# Patient Record
Sex: Male | Born: 1990 | Race: White | Hispanic: No | Marital: Married | Smoking: Never smoker
Health system: Southern US, Community
[De-identification: ages and names within clinical notes are randomized; demographics above are authoritative.]

## PROBLEM LIST (undated history)

## (undated) HISTORY — PX: OTHER SURGICAL HISTORY: SHX169

---

## 2017-08-02 ENCOUNTER — Other Ambulatory Visit: Payer: Self-pay

## 2017-08-02 ENCOUNTER — Emergency Department (HOSPITAL_COMMUNITY): Payer: BLUE CROSS/BLUE SHIELD

## 2017-08-02 ENCOUNTER — Encounter (HOSPITAL_COMMUNITY): Payer: Self-pay | Admitting: *Deleted

## 2017-08-02 ENCOUNTER — Observation Stay (HOSPITAL_COMMUNITY)
Admission: EM | Admit: 2017-08-02 | Discharge: 2017-08-08 | Disposition: A | Discharge status: Home / Self Care | Payer: BLUE CROSS/BLUE SHIELD | Attending: Student | Admitting: Student

## 2017-08-02 DIAGNOSIS — S93324A Dislocation of tarsometatarsal joint of right foot, initial encounter: Secondary | ICD-10-CM | POA: Diagnosis not present

## 2017-08-02 DIAGNOSIS — T148XXA Other injury of unspecified body region, initial encounter: Secondary | ICD-10-CM

## 2017-08-02 DIAGNOSIS — S92142A Displaced dome fracture of left talus, initial encounter for closed fracture: Secondary | ICD-10-CM | POA: Diagnosis not present

## 2017-08-02 DIAGNOSIS — Z79899 Other long term (current) drug therapy: Secondary | ICD-10-CM | POA: Insufficient documentation

## 2017-08-02 DIAGNOSIS — S92211A Displaced fracture of cuboid bone of right foot, initial encounter for closed fracture: Secondary | ICD-10-CM | POA: Diagnosis not present

## 2017-08-02 DIAGNOSIS — Y929 Unspecified place or not applicable: Secondary | ICD-10-CM | POA: Insufficient documentation

## 2017-08-02 DIAGNOSIS — M24072 Loose body in left ankle: Secondary | ICD-10-CM | POA: Diagnosis not present

## 2017-08-02 DIAGNOSIS — W1789XA Other fall from one level to another, initial encounter: Secondary | ICD-10-CM | POA: Diagnosis not present

## 2017-08-02 DIAGNOSIS — M65872 Other synovitis and tenosynovitis, left ankle and foot: Secondary | ICD-10-CM | POA: Insufficient documentation

## 2017-08-02 LAB — CBC WITH DIFFERENTIAL/PLATELET
BASOS PCT: 0 %
Basophils Absolute: 0 10*3/uL (ref 0.0–0.1)
EOS ABS: 0.1 10*3/uL (ref 0.0–0.7)
Eosinophils Relative: 1 %
HCT: 46 % (ref 39.0–52.0)
HEMOGLOBIN: 15 g/dL (ref 13.0–17.0)
Lymphocytes Relative: 17 %
Lymphs Abs: 2.6 10*3/uL (ref 0.7–4.0)
MCH: 27.5 pg (ref 26.0–34.0)
MCHC: 32.6 g/dL (ref 30.0–36.0)
MCV: 84.4 fL (ref 78.0–100.0)
MONOS PCT: 6 %
Monocytes Absolute: 0.8 10*3/uL (ref 0.1–1.0)
NEUTROS PCT: 76 %
Neutro Abs: 11.7 10*3/uL — ABNORMAL HIGH (ref 1.7–7.7)
Platelets: 282 10*3/uL (ref 150–400)
RBC: 5.45 MIL/uL (ref 4.22–5.81)
RDW: 12.7 % (ref 11.5–15.5)
WBC: 15.2 10*3/uL — AB (ref 4.0–10.5)

## 2017-08-02 LAB — BASIC METABOLIC PANEL
Anion gap: 14 (ref 5–15)
BUN: 11 mg/dL (ref 6–20)
CALCIUM: 9.4 mg/dL (ref 8.9–10.3)
CHLORIDE: 105 mmol/L (ref 101–111)
CO2: 21 mmol/L — ABNORMAL LOW (ref 22–32)
CREATININE: 1.1 mg/dL (ref 0.61–1.24)
GFR calc non Af Amer: >60 mL/min (ref >60)
Glucose, Bld: 89 mg/dL (ref 65–99)
Potassium: 3.2 mmol/L — ABNORMAL LOW (ref 3.5–5.1)
SODIUM: 140 mmol/L (ref 135–145)

## 2017-08-02 MED ORDER — PROPOFOL 10 MG/ML IV BOLUS
1.0000 mg/kg | Freq: Once | INTRAVENOUS | Status: AC
  Administered 2017-08-03: 70 mg via INTRAVENOUS
  Filled 2017-08-02: qty 20

## 2017-08-02 MED ORDER — SODIUM CHLORIDE 0.9 % IV BOLUS
1000.0000 mL | Freq: Once | INTRAVENOUS | Status: AC
  Administered 2017-08-02: 1000 mL via INTRAVENOUS

## 2017-08-02 MED ORDER — ONDANSETRON HCL 4 MG/2ML IJ SOLN
4.0000 mg | Freq: Once | INTRAMUSCULAR | Status: AC
  Administered 2017-08-02: 4 mg via INTRAVENOUS
  Filled 2017-08-02: qty 2

## 2017-08-02 MED ORDER — HYDROMORPHONE HCL 1 MG/ML IJ SOLN
1.0000 mg | Freq: Once | INTRAMUSCULAR | Status: AC
  Administered 2017-08-02: 1 mg via INTRAVENOUS
  Filled 2017-08-02: qty 1

## 2017-08-02 MED ORDER — SODIUM CHLORIDE 0.9 % IV BOLUS
1000.0000 mL | Freq: Once | INTRAVENOUS | Status: AC
Start: 2017-08-02 — End: 2017-08-02
  Administered 2017-08-02: 1000 mL via INTRAVENOUS

## 2017-08-02 NOTE — ED Notes (Signed)
Dr. Dalene SeltzerSchlossman ( EDP) explained conscious sedation procedure to pt.

## 2017-08-02 NOTE — ED Notes (Addendum)
Patient placed on a monitor / pulse oximetry , NS IV infusing , IV site intact , RT set up end tidal monitor . Waiting for EDP and orthopedic MD to perform reduction .

## 2017-08-02 NOTE — ED Triage Notes (Addendum)
Pt reports bilateral ankle and foot pain. Pt reports jumping off of a moving tractor. Obviously deformity to right foot and left ankle pain.

## 2017-08-02 NOTE — H&P (Signed)
[facility] (OTS) Consult   Patient ID: Jackson Watson MRN: [id number] DOB/AGE: Aug 09, 1990 27 y.o.  Reason for Consult:Right LisFranc fracture dislocation Referring Physician: Donnetta Hutching, MD [facility]  HPI: Jackson Watson is an 27 y.o. male otherwise healthy male who jumped off a tractor landing on the ground and injuring both feet and both ankles.  Right greater than left.  Denies any issues other than the bilateral ankle pain.  X-rays showed that he had a right Lisfranc fracture dislocation.  I was contacted by Dr. Adriana Simas for evaluation and treatment.  I felt with the displacement and possible skin compromise the transfer over to [facility] for closed reduction would be most appropriate.   History reviewed. No pertinent past medical history.  Past Surgical History:  Procedure Laterality Date  . L humerus fx repair    . r collarbone sugery      No family history on file.  Social History:  reports that he has never smoked. His smokeless tobacco use includes chew. He reports that he drinks alcohol. His drug history is not on file.  Allergies: No Known Allergies  Medications:  No current facility-administered medications on file prior to encounter.    No current outpatient medications on file prior to encounter.   ROS: Constitutional: No fever or chills Vision: No changes in vision ENT: No difficulty swallowing CV: No chest pain Pulm: No SOB or wheezing GI: No nausea or vomiting GU: No urgency or inability to hold urine Skin: No poor wound healing Neurologic: No numbness or tingling Psychiatric: No depression or anxiety Heme: No bruising Allergic: No reaction to medications or food   Exam: Blood pressure 132/69, pulse 100, temperature 98.9 F (37.2 C), temperature source Oral, resp. rate (!) 21, height 6\' 4"  (1.93 m), weight (!) 142.9 kg (315 lb), SpO2 97 %. General: No acute distress Orientation: Awake alert and oriented x3 Mood and Affect:  Cooperative and pleasant Gait: Unable to be assessed due to his fracture Coordination and balance: Within normal limits  Injured Extremity (CV, lymph, sensation, reflexes): Right lower extremity: Reveals obvious swelling and deformity about the midfoot.  He has his medial cuneiform that is displaced in his whole metatarsal and tarsometatarsal joints are dislocated laterally.  He has significant pain with any palpation.  He does endorse sensation to dorsal and plantar aspect of his foot.  He has a 2+ DP pulse he is unable to fully range his ankle due to pain.  No deformity about the knee or thigh.  He is able to move his knee and hip without any discomfort.  No lymphadenopathy and reflexes are not able to be assessed due to his pain.  Left lower extremity: Skin without lesions.  Notable swelling over the lateral aspect of the ankle and foot.  Diminished range of motion due to pain ROM, active dorsiflexion plantarflexion but severely limited.  Endorses sensation of the dorsum and plantar aspect of his foot..   Medical Decision Making: Imaging: X-rays of the right foot and ankle are reviewed which shows a comminuted Lisfranc fracture dislocation.  The first TMT joint is dislocated as is the second and third.  There is significant shortening and displacement with the plain radiographs it is difficult to fully assess the characteristics of the fracture pattern.  CT scan of the right foot shows a homolateral Lisfranc fracture dislocation.  There is significant comminution subluxation of all TMT joints.  There is significant impaction of the cuboid as well.  Labs:  CBC    Component Value Date/Time   WBC 15.2 (H) 08/02/2017 2041   RBC 5.45 08/02/2017 2041   HGB 15.0 08/02/2017 2041   HCT 46.0 08/02/2017 2041   PLT 282 08/02/2017 2041   MCV 84.4 08/02/2017 2041   MCH 27.5 08/02/2017 2041   MCHC 32.6 08/02/2017 2041   RDW 12.7 08/02/2017 2041   LYMPHSABS 2.6 08/02/2017 2041   MONOABS 0.8 08/02/2017  2041   EOSABS 0.1 08/02/2017 2041   BASOSABS 0.0 08/02/2017 2041    Medical history and chart was reviewed  Assessment/Plan: 27 year old male with significant right foot Lisfranc fracture dislocation left ankle and foot pain  Patient has a significant Lisfranc injury.  I feel that proceeding with surgical fixation is appropriate.  I will likely perform this in a staged manner.  The first stage would be a closed reductions and percutaneous pinning.  I would also likely ex fix the lateral column to restore length of the cuboid.  He is way too swollen to perform definitive ORIF of his Lisfranc joint.  We will plan to proceed with this on a delayed fashion.  I discussed risks and benefits with the patient. Risks discussed included bleeding requiring blood transfusion, bleeding causing a hematoma, infection, malunion, nonunion, damage to surrounding nerves and blood vessels, pain, hardware prominence or irritation, hardware failure, stiffness, post-traumatic arthritis, DVT/PE, compartment syndrome, and even death.  The patient agrees to proceed with surgery.  I attempted a closed reduction of the right foot.  However due to his size and significant amount of propofol that was given I was unsuccessful in being able to sedate him enough to successfully reduce the fracture dislocation.  However his skin was not significantly compromised and I felt that a further reduction or earlier trip to the operating room was not necessary.  Roby LoftsKevin P. Leanny Moeckel, MD [facility] [phone] (phone)

## 2017-08-02 NOTE — ED Provider Notes (Signed)
[facility] Provider Note   CSN: [id number] Arrival date & time: 08/02/17  1932     History   Chief Complaint Chief Complaint  Patient presents with  . Ankle Injury    HPI Jackson Watson is a 27 y.o. male.  Patient jumped off a tractor landing on the ground injuring both feet and both ankles (right greater than left.  No other injuries.  No head or neck trauma.  Patient came emergently to the hospital.  Severity of pain is serious.  Position and palpation make pain worse     History reviewed. No pertinent past medical history.  Patient Active Problem List   Diagnosis Date Noted  . Lisfranc dislocation, right, initial encounter 08/02/2017    History reviewed. No pertinent surgical history.      Home Medications    Prior to Admission medications   Not on File    Family History No family history on file.  Social History Social History   Tobacco Use  . Smoking status: Never Smoker  . Smokeless tobacco: Current User    Types: Chew  Substance Use Topics  . Alcohol use: Yes  . Drug use: Not on file     Allergies   Patient has no known allergies.   Review of Systems Review of Systems  All other systems reviewed and are negative.    Physical Exam Updated Vital Signs BP (!) 148/70   Pulse (!) 108   Temp 98.9 F (37.2 C) (Oral)   Resp 20   Ht 6\' 4"  (1.93 m)   Wt (!) 142.9 kg (315 lb)   SpO2 98%   BMI 38.34 kg/m   Physical Exam  Constitutional: He is oriented to person, place, and time. He appears well-developed and well-nourished.  HENT:  Head: Normocephalic and atraumatic.  Eyes: Conjunctivae are normal.  Neck: Neck supple.  Cardiovascular: Normal rate and regular rhythm.  Pulmonary/Chest: Effort normal and breath sounds normal.  Abdominal: Soft. Bowel sounds are normal.  Musculoskeletal:  Right foot: Tender midfoot with valgus angulation.  Left ankle/foot: Generalized tenderness throughout the ankle and foot.  Good  pulses distally in both feet.  Neurological: He is alert and oriented to person, place, and time.  Skin: Skin is warm and dry.  Psychiatric: He has a normal mood and affect. His behavior is normal.  Nursing note and vitals reviewed.    ED Treatments / Results  Labs (all labs ordered are listed, but only abnormal results are displayed) Labs Reviewed  CBC WITH DIFFERENTIAL/PLATELET - Abnormal; Notable for the following components:      Result Value   WBC 15.2 (*)    Neutro Abs 11.7 (*)    All other components within normal limits  BASIC METABOLIC PANEL - Abnormal; Notable for the following components:   Potassium 3.2 (*)    CO2 21 (*)    All other components within normal limits    EKG None  Radiology Dg Ankle Complete Left  Result Date: 08/02/2017 CLINICAL DATA:  Pain after jumping from a truck. EXAM: LEFT ANKLE COMPLETE - 3+ VIEW COMPARISON:  None. FINDINGS: Small avulsion fractures off the dorsum the talus across the tibiotalar and talonavicular joints. There is no evidence of arthropathy or other focal bone abnormality. Soft tissue swelling over the malleoli and induration of the Kager's fat pad. Intact ankle mortise. Intact subtalar and midfoot articulations. Small dorsal calcaneal enthesophyte. IMPRESSION: Soft tissue swelling about malleoli. Small cortical avulsions across the talonavicular and tibiotalar  joints involving the dorsum of the anterior and body of talus. Electronically Signed   By: Tollie Eth M.D.   On: 08/02/2017 20:17   Dg Ankle Complete Right  Result Date: 08/02/2017 CLINICAL DATA:  Right foot and ankle pain after jumping off a truck and landing on his feet. EXAM: RIGHT ANKLE - COMPLETE 3+ VIEW COMPARISON:  None. FINDINGS: Acute homolateral Lisfranc fracture dislocation is identified with lateral displacement of the metatarsal bases and medial dislocation of the medial cuneiform. Small ossific fracture fragments project about the midfoot likely off the base of  the metatarsals, the largest is seen on the AP view measuring 1.4 x 0.9 cm likely off the second metatarsal base. Additional 5 and 6 mm fracture fragments project over the dorsum midfoot. Intact distal tibia and fibula. Intact ankle and subtalar joints. Intact talonavicular and calcaneocuboid articulations. Calcaneal enthesopathy is seen along the plantar dorsal aspect. Intact anterolateral process of the calcaneus. IMPRESSION: Acute homolateral Lisfranc fracture-dislocation. Electronically Signed   By: Tollie Eth M.D.   On: 08/02/2017 20:16   Dg Foot Complete Left  Result Date: 08/02/2017 CLINICAL DATA:  Pain after jumping from a truck. EXAM: LEFT FOOT - COMPLETE 3+ VIEW COMPARISON:  None. FINDINGS: Small avulsions off the dorsal aspect of the talus across the tibiotalar and talonavicular joints. Lisfranc articulation appears intact. Minimal dorsal calcaneal enthesopathy. There is no evidence of arthropathy or other focal bone abnormality. Soft tissues are unremarkable. IMPRESSION: 1. Small superficial cortical avulsions off the dorsum of the body and anterior talus attention representing joint capsular avulsions. 2. No joint dislocation. 3. Periarticular soft tissue swelling about the ankle and dorsum of the midfoot. 4. Dorsal calcaneal enthesopathy. Electronically Signed   By: Tollie Eth M.D.   On: 08/02/2017 20:25   Dg Foot Complete Right  Result Date: 08/02/2017 CLINICAL DATA:  Pain after jumping from truck. EXAM: RIGHT FOOT COMPLETE - 3+ VIEW COMPARISON:  None. FINDINGS: Acute homolateral Lisfranc fracture-dislocation is identified with base of first metatarsal laterally dislocated from the medial cuneiform. There is a widened appearance of the joint space between the medial and middle cuneiform. Small fracture fragments off the lateral aspect of the cuboid and likely base of second metatarsal are noted in addition to smaller fracture fragment seen off the dorsum midfoot, donor site uncertain but  measuring between 6 and 7 mm. The tibiotalar and subtalar joints are maintained. Small calcaneal enthesophytes are noted. The toes are intact. IMPRESSION: Acute homolateral Lisfranc fracture-dislocation of the right foot as above described. Electronically Signed   By: Tollie Eth M.D.   On: 08/02/2017 20:30    Procedures Procedures (including critical care time)  Medications Ordered in ED Medications  sodium chloride 0.9 % bolus 1,000 mL (1,000 mLs Intravenous New Bag/Given 08/02/17 2046)  HYDROmorphone (DILAUDID) injection 1 mg (1 mg Intravenous Given 08/02/17 2046)  ondansetron (ZOFRAN) injection 4 mg (4 mg Intravenous Given 08/02/17 2046)     Initial Impression / Assessment and Plan / ED Course  I have reviewed the triage vital signs and the nursing notes.  Pertinent labs & imaging results that were available during my care of the patient were reviewed by me and considered in my medical decision making (see chart for details).     Patient has sustained a Lisfranc fracture/dislocation of the right foot.  Discussed with orthopedic surgeon Dr. [name].  CT foot pending.  Will transfer to [facility] emergency department.  Discussed with colleague Dr. Erin Hearing.  Pain management given.  Final Clinical Impressions(s) / ED Diagnoses   Final diagnoses:  Dislocation of tarsometatarsal joint of right foot, initial encounter    ED Discharge Orders    None       Donnetta Hutching, MD 08/02/17 2116

## 2017-08-02 NOTE — ED Notes (Signed)
Dr. Jena GaussHaddix ( ortho) explained admission and plan of care to pt .

## 2017-08-02 NOTE — ED Notes (Signed)
[facility] here to transport pt 

## 2017-08-02 NOTE — ED Notes (Signed)
[facility].

## 2017-08-02 NOTE — ED Triage Notes (Signed)
[facility]; received 1.5mg  dilaudid for pain enroute via [facility] at approximately 2210.  Per report, [facility] was able to palpate bilateral pedal pulse, [facility].

## 2017-08-02 NOTE — ED Notes (Signed)
Patient signed consent form for conscious sedation and manual reduction of right foot fracture/disclocation .

## 2017-08-02 NOTE — ED Provider Notes (Addendum)
Patient presents to the ED by EMS from any pain for Lisfranc fracture dislocation.  Patient is here to see Dr. Jena GaussHaddix with orthopedic surgery.  I evaluated patient when he arrived to the ED.  Pain is under control at this time.  Patient DP pulses are 1+ bilaterally with significant edema noted.  Limited range of motion secondary to pain.  Vital signs are reassuring.  Cap refill is normal.  Sensation intact.  I did page Dr. Jena GaussHaddix with orthopedics to let him know the patient is in the ED for his evaluation.    He has asked that we help sedate patient to allow for reduction by orthopedics.  Bedside sedation was performed by attending Dr. Dalene SeltzerSchlossman.  Dr. Jena GaussHaddix had successful reduction of dislocation.  He will place admission orders.  Patient tolerated procedure without any difficulties.  Remains hemodynamically stable.  Rise MuLeaphart, Kenneth T, PA-C 08/02/17 2320    Rise MuLeaphart, Kenneth T, PA-C 08/03/17 [id number]    Alvira MondaySchlossman, Erin, MD 08/06/17 2251

## 2017-08-03 ENCOUNTER — Observation Stay (HOSPITAL_COMMUNITY): Payer: BLUE CROSS/BLUE SHIELD

## 2017-08-03 ENCOUNTER — Observation Stay (HOSPITAL_COMMUNITY): Payer: BLUE CROSS/BLUE SHIELD | Admitting: Certified Registered Nurse Anesthetist

## 2017-08-03 ENCOUNTER — Encounter (HOSPITAL_COMMUNITY): Payer: Self-pay | Admitting: Certified Registered Nurse Anesthetist

## 2017-08-03 ENCOUNTER — Encounter (HOSPITAL_COMMUNITY)
Admission: EM | Disposition: A | Discharge status: Home / Self Care | Payer: Self-pay | Source: Home / Self Care | Attending: Emergency Medicine

## 2017-08-03 HISTORY — PX: OPEN REDUCTION INTERNAL FIXATION (ORIF) FOOT LISFRANC FRACTURE: SHX5990

## 2017-08-03 LAB — HIV ANTIBODY (ROUTINE TESTING W REFLEX): HIV Screen 4th Generation wRfx: NONREACTIVE

## 2017-08-03 SURGERY — OPEN REDUCTION INTERNAL FIXATION (ORIF) FOOT LISFRANC FRACTURE
Anesthesia: General | Site: Leg Lower | Laterality: Right

## 2017-08-03 MED ORDER — CEFAZOLIN SODIUM-DEXTROSE 2-4 GM/100ML-% IV SOLN
2.0000 g | Freq: Three times a day (TID) | INTRAVENOUS | Status: AC
  Administered 2017-08-03 – 2017-08-04 (×3): 2 g via INTRAVENOUS
  Filled 2017-08-03 (×3): qty 100

## 2017-08-03 MED ORDER — DIPHENHYDRAMINE HCL 12.5 MG/5ML PO ELIX: 12.5000 mg | ORAL_SOLUTION | ORAL | Status: DC | PRN

## 2017-08-03 MED ORDER — ACETAMINOPHEN 650 MG RE SUPP: 650.0000 mg | Freq: Four times a day (QID) | RECTAL | Status: DC | PRN

## 2017-08-03 MED ORDER — POVIDONE-IODINE 10 % EX SWAB
2.0000 "application " | Freq: Once | CUTANEOUS | Status: DC
  Filled 2017-08-03: qty 2

## 2017-08-03 MED ORDER — METHOCARBAMOL 1000 MG/10ML IJ SOLN
500.0000 mg | Freq: Four times a day (QID) | INTRAVENOUS | Status: DC | PRN
  Filled 2017-08-03: qty 5

## 2017-08-03 MED ORDER — MIDAZOLAM HCL 2 MG/2ML IJ SOLN
INTRAMUSCULAR | Status: DC | PRN
  Administered 2017-08-03: 2 mg via INTRAVENOUS

## 2017-08-03 MED ORDER — FENTANYL CITRATE (PF) 100 MCG/2ML IJ SOLN
100.0000 ug | Freq: Once | INTRAMUSCULAR | Status: AC
Start: 2017-08-03 — End: 2017-08-03
  Administered 2017-08-03: 100 ug via INTRAVENOUS

## 2017-08-03 MED ORDER — PROPOFOL 10 MG/ML IV BOLUS
INTRAVENOUS | Status: AC
  Filled 2017-08-03: qty 20

## 2017-08-03 MED ORDER — PROPOFOL 10 MG/ML IV BOLUS
INTRAVENOUS | Status: DC | PRN
  Administered 2017-08-03: 200 mg via INTRAVENOUS

## 2017-08-03 MED ORDER — PROPOFOL 10 MG/ML IV BOLUS
INTRAVENOUS | Status: AC | PRN
  Administered 2017-08-03: 130 mg via INTRAVENOUS

## 2017-08-03 MED ORDER — FENTANYL CITRATE (PF) 100 MCG/2ML IJ SOLN
INTRAMUSCULAR | Status: AC
  Administered 2017-08-03: 100 ug via INTRAVENOUS
  Filled 2017-08-03: qty 2

## 2017-08-03 MED ORDER — FENTANYL CITRATE (PF) 100 MCG/2ML IJ SOLN
100.0000 ug | Freq: Once | INTRAMUSCULAR | Status: AC
  Administered 2017-08-03: 100 ug via INTRAVENOUS

## 2017-08-03 MED ORDER — DEXTROSE 5 % IV SOLN
3.0000 g | INTRAVENOUS | Status: AC
  Administered 2017-08-03: 3 g via INTRAVENOUS
  Filled 2017-08-03: qty 3

## 2017-08-03 MED ORDER — DEXAMETHASONE SODIUM PHOSPHATE 10 MG/ML IJ SOLN
INTRAMUSCULAR | Status: AC
  Filled 2017-08-03: qty 1

## 2017-08-03 MED ORDER — OXYCODONE HCL 5 MG PO TABS
5.0000 mg | ORAL_TABLET | ORAL | Status: DC | PRN
  Administered 2017-08-03 – 2017-08-05 (×10): 10 mg via ORAL
  Filled 2017-08-03 (×10): qty 2

## 2017-08-03 MED ORDER — FENTANYL CITRATE (PF) 100 MCG/2ML IJ SOLN
50.0000 ug | Freq: Once | INTRAMUSCULAR | Status: AC
  Administered 2017-08-03: 50 ug via INTRAVENOUS
  Filled 2017-08-03: qty 2

## 2017-08-03 MED ORDER — FENTANYL CITRATE (PF) 250 MCG/5ML IJ SOLN
INTRAMUSCULAR | Status: DC | PRN
  Administered 2017-08-03: 150 ug via INTRAVENOUS
  Administered 2017-08-03 (×2): 50 ug via INTRAVENOUS

## 2017-08-03 MED ORDER — ONDANSETRON HCL 4 MG/2ML IJ SOLN
INTRAMUSCULAR | Status: AC
  Filled 2017-08-03: qty 2

## 2017-08-03 MED ORDER — ONDANSETRON HCL 4 MG PO TABS: 4.0000 mg | ORAL_TABLET | Freq: Four times a day (QID) | ORAL | Status: DC | PRN

## 2017-08-03 MED ORDER — DEXAMETHASONE SODIUM PHOSPHATE 10 MG/ML IJ SOLN
INTRAMUSCULAR | Status: AC
  Filled 2017-08-03: qty 2

## 2017-08-03 MED ORDER — SUCCINYLCHOLINE CHLORIDE 20 MG/ML IJ SOLN
INTRAMUSCULAR | Status: DC | PRN
  Administered 2017-08-03: 120 mg via INTRAVENOUS

## 2017-08-03 MED ORDER — MIDAZOLAM HCL 2 MG/2ML IJ SOLN
INTRAMUSCULAR | Status: AC
  Administered 2017-08-03: 2 mg via INTRAVENOUS
  Filled 2017-08-03: qty 2

## 2017-08-03 MED ORDER — 0.9 % SODIUM CHLORIDE (POUR BTL) OPTIME
TOPICAL | Status: DC | PRN
  Administered 2017-08-03: 1000 mL

## 2017-08-03 MED ORDER — FENTANYL CITRATE (PF) 100 MCG/2ML IJ SOLN
50.0000 ug | Freq: Once | INTRAMUSCULAR | Status: AC
Start: 2017-08-03 — End: 2017-08-03
  Administered 2017-08-03: 50 ug via INTRAVENOUS
  Filled 2017-08-03: qty 2

## 2017-08-03 MED ORDER — CHLORHEXIDINE GLUCONATE 4 % EX LIQD
60.0000 mL | Freq: Once | CUTANEOUS | Status: DC
  Filled 2017-08-03: qty 60

## 2017-08-03 MED ORDER — SUGAMMADEX SODIUM 200 MG/2ML IV SOLN
INTRAVENOUS | Status: DC | PRN
  Administered 2017-08-03: 200 mg via INTRAVENOUS

## 2017-08-03 MED ORDER — ONDANSETRON HCL 4 MG/2ML IJ SOLN
4.0000 mg | Freq: Four times a day (QID) | INTRAMUSCULAR | Status: DC | PRN
  Administered 2017-08-03 – 2017-08-07 (×2): 4 mg via INTRAVENOUS

## 2017-08-03 MED ORDER — METHOCARBAMOL 500 MG PO TABS
500.0000 mg | ORAL_TABLET | Freq: Four times a day (QID) | ORAL | Status: DC | PRN
  Administered 2017-08-03 – 2017-08-08 (×10): 500 mg via ORAL
  Filled 2017-08-03 (×10): qty 1

## 2017-08-03 MED ORDER — LIDOCAINE 2% (20 MG/ML) 5 ML SYRINGE
INTRAMUSCULAR | Status: DC | PRN
  Administered 2017-08-03: 100 mg via INTRAVENOUS

## 2017-08-03 MED ORDER — MIDAZOLAM HCL 2 MG/2ML IJ SOLN
INTRAMUSCULAR | Status: AC
  Filled 2017-08-03: qty 2

## 2017-08-03 MED ORDER — HYDROMORPHONE HCL 2 MG/ML IJ SOLN: 0.3000 mg | INTRAMUSCULAR | Status: DC | PRN

## 2017-08-03 MED ORDER — VANCOMYCIN HCL 1000 MG IV SOLR
INTRAVENOUS | Status: AC
Start: 2017-08-03 — End: ?
  Filled 2017-08-03: qty 1000

## 2017-08-03 MED ORDER — ROCURONIUM BROMIDE 10 MG/ML (PF) SYRINGE
PREFILLED_SYRINGE | INTRAVENOUS | Status: DC | PRN
  Administered 2017-08-03: 50 mg via INTRAVENOUS
  Administered 2017-08-03 (×2): 20 mg via INTRAVENOUS

## 2017-08-03 MED ORDER — DOCUSATE SODIUM 100 MG PO CAPS
100.0000 mg | ORAL_CAPSULE | Freq: Two times a day (BID) | ORAL | Status: DC
  Administered 2017-08-03 – 2017-08-06 (×7): 100 mg via ORAL
  Filled 2017-08-03 (×7): qty 1

## 2017-08-03 MED ORDER — SUCCINYLCHOLINE CHLORIDE 200 MG/10ML IV SOSY
PREFILLED_SYRINGE | INTRAVENOUS | Status: AC
  Filled 2017-08-03: qty 10

## 2017-08-03 MED ORDER — LACTATED RINGERS IV SOLN
INTRAVENOUS | Status: DC
  Administered 2017-08-03 (×3): via INTRAVENOUS

## 2017-08-03 MED ORDER — LIDOCAINE 2% (20 MG/ML) 5 ML SYRINGE
INTRAMUSCULAR | Status: AC
  Filled 2017-08-03: qty 15

## 2017-08-03 MED ORDER — FENTANYL CITRATE (PF) 250 MCG/5ML IJ SOLN
INTRAMUSCULAR | Status: AC
  Filled 2017-08-03: qty 5

## 2017-08-03 MED ORDER — MORPHINE SULFATE (PF) 4 MG/ML IV SOLN
2.0000 mg | INTRAVENOUS | Status: DC | PRN
  Administered 2017-08-03 – 2017-08-07 (×8): 2 mg via INTRAVENOUS
  Filled 2017-08-03 (×8): qty 1

## 2017-08-03 MED ORDER — FENTANYL CITRATE (PF) 100 MCG/2ML IJ SOLN
INTRAMUSCULAR | Status: AC
  Filled 2017-08-03: qty 2

## 2017-08-03 MED ORDER — ONDANSETRON HCL 4 MG/2ML IJ SOLN
INTRAMUSCULAR | Status: AC
  Filled 2017-08-03: qty 4

## 2017-08-03 MED ORDER — MIDAZOLAM HCL 2 MG/2ML IJ SOLN
2.0000 mg | Freq: Once | INTRAMUSCULAR | Status: AC
  Administered 2017-08-03: 2 mg via INTRAVENOUS

## 2017-08-03 MED ORDER — DEXAMETHASONE SODIUM PHOSPHATE 10 MG/ML IJ SOLN
INTRAMUSCULAR | Status: DC | PRN
  Administered 2017-08-03: 5 mg via INTRAVENOUS

## 2017-08-03 MED ORDER — ACETAMINOPHEN 325 MG PO TABS
650.0000 mg | ORAL_TABLET | Freq: Four times a day (QID) | ORAL | Status: DC | PRN
  Administered 2017-08-04 – 2017-08-05 (×2): 650 mg via ORAL
  Filled 2017-08-03 (×2): qty 2

## 2017-08-03 MED ORDER — VANCOMYCIN HCL 1000 MG IV SOLR
INTRAVENOUS | Status: DC | PRN
  Administered 2017-08-03: 1000 mg

## 2017-08-03 MED ORDER — ROCURONIUM BROMIDE 10 MG/ML (PF) SYRINGE
PREFILLED_SYRINGE | INTRAVENOUS | Status: AC
  Filled 2017-08-03: qty 15

## 2017-08-03 MED FILL — Hydromorphone HCl Inj 2 MG/ML: INTRAMUSCULAR | Qty: 1 | Status: AC

## 2017-08-03 SURGICAL SUPPLY — 74 items
BANDAGE ACE 4X5 VEL STRL LF (GAUZE/BANDAGES/DRESSINGS) IMPLANT
BANDAGE ACE 6X5 VEL STRL LF (GAUZE/BANDAGES/DRESSINGS) IMPLANT
BANDAGE ELASTIC 4 VELCRO ST LF (GAUZE/BANDAGES/DRESSINGS) ×3 IMPLANT
BANDAGE ELASTIC 6 VELCRO ST LF (GAUZE/BANDAGES/DRESSINGS) ×3 IMPLANT
BANDAGE ESMARK 6X9 LF (GAUZE/BANDAGES/DRESSINGS) ×1 IMPLANT
BIT DRILL CANN 3.2 (BIT) ×1 IMPLANT
BIT DRILL KIT 2.65MM (BIT) ×1 IMPLANT
BNDG COHESIVE 4X5 TAN STRL (GAUZE/BANDAGES/DRESSINGS) ×3 IMPLANT
BNDG ESMARK 6X9 LF (GAUZE/BANDAGES/DRESSINGS) ×3
BRUSH SCRUB SURG 4.25 DISP (MISCELLANEOUS) ×6 IMPLANT
CHLORAPREP W/TINT 26ML (MISCELLANEOUS) ×6 IMPLANT
COVER MAYO STAND STRL (DRAPES) ×3 IMPLANT
COVER SURGICAL LIGHT HANDLE (MISCELLANEOUS) ×3 IMPLANT
DRAPE C-ARM 42X72 X-RAY (DRAPES) ×3 IMPLANT
DRAPE C-ARMOR (DRAPES) ×3 IMPLANT
DRAPE HALF SHEET 40X57 (DRAPES) ×6 IMPLANT
DRAPE ORTHO SPLIT 77X108 STRL (DRAPES) ×4
DRAPE SURG ORHT 6 SPLT 77X108 (DRAPES) ×2 IMPLANT
DRAPE U-SHAPE 47X51 STRL (DRAPES) ×3 IMPLANT
DRILL BIT CANN 3.2 (BIT) ×3
DRILL KIT 2.65MM (BIT) ×3
DRSG ADAPTIC 3X8 NADH LF (GAUZE/BANDAGES/DRESSINGS) IMPLANT
ELECT REM PT RETURN 9FT ADLT (ELECTROSURGICAL) ×3
ELECTRODE REM PT RTRN 9FT ADLT (ELECTROSURGICAL) ×1 IMPLANT
GAUZE SPONGE 4X4 12PLY STRL (GAUZE/BANDAGES/DRESSINGS) ×3 IMPLANT
GLOVE BIO SURGEON ST LM GN SZ9 (GLOVE) ×3 IMPLANT
GLOVE BIO SURGEON STRL SZ 6.5 (GLOVE) ×2 IMPLANT
GLOVE BIO SURGEON STRL SZ7.5 (GLOVE) ×27 IMPLANT
GLOVE BIO SURGEONS STRL SZ 6.5 (GLOVE) ×1
GLOVE BIOGEL PI IND STRL 6.5 (GLOVE) ×1 IMPLANT
GLOVE BIOGEL PI IND STRL 7.5 (GLOVE) ×1 IMPLANT
GLOVE BIOGEL PI INDICATOR 6.5 (GLOVE) ×2
GLOVE BIOGEL PI INDICATOR 7.5 (GLOVE) ×2
GLOVE SURG SS PI 8.0 STRL IVOR (GLOVE) ×6 IMPLANT
GOWN STRL REUS W/ TWL LRG LVL3 (GOWN DISPOSABLE) ×2 IMPLANT
GOWN STRL REUS W/TWL LRG LVL3 (GOWN DISPOSABLE) ×4
GUIDEWIRE 1.6 (WIRE) ×8
GUIDEWIRE ORTH 220X1.6XTROC (WIRE) ×4 IMPLANT
IMPL SPEED TITAN 20X20X20 (Orthopedic Implant) ×1 IMPLANT
IMPLANT SPEED TITAN 20X20X20 (Orthopedic Implant) ×3 IMPLANT
KIT ELITE COMPRESSION 20X20X20 (Miscellaneous) IMPLANT
KIT PREVENA INCISION MGT 13 (CANNISTER) ×3 IMPLANT
KIT TURNOVER KIT B (KITS) ×3 IMPLANT
MANIFOLD NEPTUNE II (INSTRUMENTS) ×3 IMPLANT
NEEDLE 25GAX1.5 (MISCELLANEOUS) ×3 IMPLANT
NEEDLE HYPO 21X1.5 SAFETY (NEEDLE) IMPLANT
NS IRRIG 1000ML POUR BTL (IV SOLUTION) ×3 IMPLANT
PACK TOTAL JOINT (CUSTOM PROCEDURE TRAY) ×3 IMPLANT
PAD ABD 8X10 STRL (GAUZE/BANDAGES/DRESSINGS) ×3 IMPLANT
PAD ARMBOARD 7.5X6 YLW CONV (MISCELLANEOUS) ×6 IMPLANT
PAD CAST 4YDX4 CTTN HI CHSV (CAST SUPPLIES) ×1 IMPLANT
PADDING CAST COTTON 4X4 STRL (CAST SUPPLIES) ×2
PADDING CAST COTTON 6X4 STRL (CAST SUPPLIES) ×3 IMPLANT
SCREW HEADLESS COMP 4.5X50 (Screw) ×3 IMPLANT
SCREW HEADLESS COMP 4.5X52 (Screw) ×3 IMPLANT
SPLINT PLASTER CAST XFAST 5X30 (CAST SUPPLIES) ×1 IMPLANT
SPLINT PLASTER XFAST SET 5X30 (CAST SUPPLIES) ×2
SPONGE LAP 18X18 X RAY DECT (DISPOSABLE) IMPLANT
STAPLER VISISTAT 35W (STAPLE) IMPLANT
SUCTION FRAZIER HANDLE 10FR (MISCELLANEOUS) ×2
SUCTION TUBE FRAZIER 10FR DISP (MISCELLANEOUS) ×1 IMPLANT
SUT ETHILON 3 0 PS 1 (SUTURE) IMPLANT
SUT PROLENE 0 CT (SUTURE) IMPLANT
SUT VIC AB 0 CT1 27 (SUTURE) ×2
SUT VIC AB 0 CT1 27XBRD ANBCTR (SUTURE) ×1 IMPLANT
SUT VIC AB 2-0 CT1 27 (SUTURE) ×4
SUT VIC AB 2-0 CT1 TAPERPNT 27 (SUTURE) ×2 IMPLANT
SYR CONTROL 10ML LL (SYRINGE) IMPLANT
TOWEL OR 17X24 6PK STRL BLUE (TOWEL DISPOSABLE) ×3 IMPLANT
TOWEL OR 17X26 10 PK STRL BLUE (TOWEL DISPOSABLE) ×6 IMPLANT
TUBE CONNECTING 12'X1/4 (SUCTIONS) ×1
TUBE CONNECTING 12X1/4 (SUCTIONS) ×2 IMPLANT
UNDERPAD 30X30 (UNDERPADS AND DIAPERS) ×3 IMPLANT
WATER STERILE IRR 1000ML POUR (IV SOLUTION) ×3 IMPLANT

## 2017-08-03 NOTE — Sedation Documentation (Signed)
Dr. [name] reduced pt.'s right foot fracture .

## 2017-08-03 NOTE — ED Provider Notes (Signed)
Physical Exam  BP 125/80   Pulse 87   Temp 98.1 F (36.7 C) (Oral)   Resp 20   Ht 6\' 4"  (1.93 m)   Wt (!) 142.9 kg (315 lb)   SpO2 96%   BMI 38.34 kg/m   Physical Exam  Constitutional: He is oriented to person, place, and time. He appears well-developed and well-nourished. No distress.  HENT:  Head: Normocephalic and atraumatic.  Eyes: Conjunctivae and EOM are normal.  Cardiovascular: Normal rate, regular rhythm, normal heart sounds and intact distal pulses. Exam reveals no gallop and no friction rub.  No murmur heard. Pulmonary/Chest: Effort normal and breath sounds normal. No respiratory distress. He has no wheezes. He has no rales.  Musculoskeletal: He exhibits no edema.  Neurological: He is alert and oriented to person, place, and time.  Skin: Skin is warm and dry. He is not diaphoretic.  Nursing note and vitals reviewed.   ED Course/Procedures     .Sedation Date/Time: 08/04/2017 9:10 PM Performed by: Alvira Monday, MD Authorized by: Alvira Monday, MD   Consent:    Consent obtained:  Written   Consent given by:  Patient   Risks discussed:  Allergic reaction, dysrhythmia, inadequate sedation, nausea, prolonged hypoxia resulting in organ damage, prolonged sedation necessitating reversal, respiratory compromise necessitating ventilatory assistance and intubation and vomiting   Alternatives discussed:  Analgesia without sedation and anxiolysis Universal protocol:    Procedure explained and questions answered to patient or proxy's satisfaction: yes     Relevant documents present and verified: yes     Test results available and properly labeled: yes     Imaging studies available: yes     Required blood products, implants, devices, and special equipment available: yes     Site/side marked: yes     Immediately prior to procedure a time out was called: yes     Patient identity confirmation method:  Verbally with patient Indications:    Procedure performed:   Dislocation reduction   Procedure necessitating sedation performed by:  Different physician   Intended level of sedation:  Deep Pre-sedation assessment:    Time since last food or drink:  3   ASA classification: class 1 - normal, healthy patient     Neck mobility: normal     Mouth opening:  3 or more finger widths   Thyromental distance:  4 finger widths   Mallampati score:  I - soft palate, uvula, fauces, pillars visible   Pre-sedation assessments completed and reviewed: airway patency, cardiovascular function, hydration status, mental status, nausea/vomiting, pain level, respiratory function and temperature   Immediate pre-procedure details:    Reassessment: Patient reassessed immediately prior to procedure     Reviewed: vital signs, relevant labs/tests and NPO status     Verified: bag valve mask available, emergency equipment available, intubation equipment available, IV patency confirmed, oxygen available and suction available   Procedure details (see MAR for exact dosages):    Preoxygenation:  Nasal cannula   Sedation:  Propofol   Intra-procedure monitoring:  Blood pressure monitoring, cardiac monitor, continuous pulse oximetry, frequent LOC assessments, frequent vital sign checks and continuous capnometry   Intra-procedure events: none     Total Provider sedation time (minutes):  10 Post-procedure details:    Attendance: Constant attendance by certified staff until patient recovered     Recovery: Patient returned to pre-procedure baseline     Post-sedation assessments completed and reviewed: airway patency, cardiovascular function, hydration status, mental status, nausea/vomiting, pain level, respiratory function and  temperature     Patient is stable for discharge or admission: yes     Patient tolerance:  Tolerated well, no immediate complications    MDM  27yo male presenting from OSH for lisfranc fracture-dislocation. Performed sedation with Dr. Jena GaussHaddix performing reduction.        Alvira MondaySchlossman, Carla Rashad, MD 08/04/17 2116

## 2017-08-03 NOTE — Transfer of Care (Signed)
Immediate Anesthesia Transfer of Care Note  Patient: Jackson Watson  Procedure(s) Performed: OPEN REDUCTION INTERNAL FIXATION (ORIF) FOOT LISFRANC FRACTURE (Right Leg Lower)  Patient Location: PACU  Anesthesia Type:GA combined with regional for post-op pain  Level of Consciousness: awake, alert  and patient cooperative  Airway & Oxygen Therapy: Patient Spontanous Breathing and Patient connected to nasal cannula oxygen  Post-op Assessment: Report given to RN and Post -op Vital signs reviewed and stable  Post vital signs: Reviewed and stable  Last Vitals:  Vitals Value Taken Time  BP 139/73 08/03/2017  4:22 PM  Temp    Pulse 102 08/03/2017  4:26 PM  Resp 20 08/03/2017  4:26 PM  SpO2 97 % 08/03/2017  4:26 PM  Vitals shown include unvalidated device data.  Last Pain:  Vitals:   08/03/17 0813  TempSrc:   PainSc: 8          Complications: No apparent anesthesia complications

## 2017-08-03 NOTE — Sedation Documentation (Signed)
Patient alert and oriented/respirations unlabored , IV NS bolus infusing/IV site unremarkable , right lower leg splint intact .

## 2017-08-03 NOTE — Anesthesia Procedure Notes (Signed)
Anesthesia Regional Block: Popliteal block   Pre-Anesthetic Checklist: ,, timeout performed, Correct Patient, Correct Site, Correct Laterality, Correct Procedure, Correct Position, site marked, Risks and benefits discussed,  Surgical consent,  Pre-op evaluation,  At surgeon's request and post-op pain management  Laterality: Right and Lower  Prep: chloraprep       Needles:  Injection technique: Single-shot  Needle Type: Echogenic Stimulator Needle     Needle Length: 10cm  Needle Gauge: 21   Needle insertion depth: 2 cm   Additional Needles:   Narrative:  Start time: 08/03/2017 1:00 PM End time: 08/03/2017 1:10 PM Injection made incrementally with aspirations every 5 mL.  Performed by: Personally  Anesthesiologist: Leilani Able, MD

## 2017-08-03 NOTE — Progress Notes (Signed)
Notified MD about beeping prevena wound vac.

## 2017-08-03 NOTE — Anesthesia Procedure Notes (Signed)
Procedure Name: Intubation Date/Time: 08/03/2017 1:42 PM Performed by: White, Amedeo Plenty, CRNA Pre-anesthesia Checklist: Patient identified, Emergency Drugs available, Suction available and Patient being monitored Patient Re-evaluated:Patient Re-evaluated prior to induction Oxygen Delivery Method: Circle System Utilized Preoxygenation: Pre-oxygenation with 100% oxygen Induction Type: IV induction and Rapid sequence Ventilation: Mask ventilation without difficulty Laryngoscope Size: Mac and 4 Grade View: Grade I Tube type: Oral Tube size: 7.5 mm Number of attempts: 1 Airway Equipment and Method: Stylet Placement Confirmation: ETT inserted through vocal cords under direct vision,  positive ETCO2 and breath sounds checked- equal and bilateral Secured at: 24 cm Tube secured with: Tape Dental Injury: Teeth and Oropharynx as per pre-operative assessment

## 2017-08-03 NOTE — Sedation Documentation (Signed)
Ortho tech at bedside applying posterior short leg splint ,  Pt. alert and oriented , no pain , respirations unlabored , IV site intact , NS IV bolus infusing .

## 2017-08-03 NOTE — Op Note (Signed)
OrthopaedicSurgeryOperativeNote (BMW:[id number](CSN:[id number]) Date of Surgery:  08/03/2017  Admit Date: 08/02/2017   Diagnoses: Pre-Op Diagnoses: Right Lisfranc fracture dislocation   Post-Op Diagnosis: Same  Procedures: 1. CPT 28615(x3)-Open reduction internal fixation of tarsometatarsal dislocations 1st-3rd 2. CPT 28730-Fusion of Lisfranc joint 3. CPT 28605-Closed reduction of 4th and 5th TMT joint dislocations 4. CPT 28455-Closed treatment of right cuboid fracture 5. CPT 97605-Incisional wound vac placement Surgeons: Primary: Arrayah Connors, Gillie MannersKevin P, MD Assisting: Nadara Mustarduda, Marcus V, MD   Location:[facility] OR ROOM 03   AnesthesiaGeneral   Antibiotics:Ancef 2g preop  Tourniquettime: Total Tourniquet Time Documented: Thigh (Right) - 91 minutes Total: Thigh (Right) - 91 minutes  EstimatedBloodLoss:Minimal  Complications:None  Specimens:None  Implants: Implant Name Type Inv. Item Serial No. Manufacturer Lot No. LRB No. Used Action  IMPLANT SPEED TITAN [id number] - [id number] Orthopedic Implant IMPLANT SPEED TITAN [id number]  [employer] [id number] Right 1 Implanted  SCREW HEADLESS COMP 4.5X50 - [id number] Screw SCREW HEADLESS COMP 4.5X50  [employer]  Right 1 Implanted  SCREW HEADLESS COMP 4.5X52 - [id number] Screw SCREW HEADLESS COMP 4.5X52  [employer]  Right 1 Implanted    IndicationsforSurgery: 27 y.o. male otherwise healthy male who jumped off a tractor landing on the ground and injuring both feet and both ankles.  Right greater than left.  X-rays showed that he had a right Lisfranc fracture dislocation.  I attempted a closed reduction in the emergency room however was unsuccessful in manipulating the Lisfranc joint back into position.  As result I felt that proceeding to the operating room for possible closed versus open reduction would be most appropriate.  I discussed risks and benefits with the patient. Risks discussed included bleeding requiring blood transfusion,  bleeding causing a hematoma, infection, malunion, nonunion, damage to surrounding nerves and blood vessels, pain, hardware prominence or irritation, hardware failure, stiffness, post-traumatic arthritis, DVT/PE, compartment syndrome, and even death. The patient and their family agreed to proceed with surgery and consent was obtained.  Operative Findings: 1.  Right Lisfranc fracture dislocation of first through fifth tarsometatarsal joints with associated impacted cuboid fracture. 2.  Unsuccessful closed reduction of first tarsometatarsal joint requiring open approach for reduction. Anterior tibialis tendon was incarcerated in the first tarsometatarsal joint thereby preventing closed reduction 3.  Open reduction and fusion of first through third tarsometatarsal joints as well as the intertarsal joint between the medial and middle cuneiform joint.  Fixation provided by 4.5 mm Synthes headless compression screws and reinforced with a Titan staple across the first tarsometatarsal joint. 4.  Closed reduction of fourth and fifth tarsometatarsal joint dislocations.  Nonoperative treatment of the impacted cuboid fracture.  Procedure: The patient was identified in the preoperative holding area. Consent was confirmed with the patient and their family and all questions were answered. The operative extremity was marked after confirmation with the patient. he was then brought back to the operating room by our anesthesia colleagues.  He was then carefully transferred over to a radiolucent flat top table.  He was placed under general anesthetic.  His splint was then cut down.  A bump was placed under his operative extremity. The operative extremity was then prepped and draped in usual sterile fashion. A preoperative timeout was performed to verify the patient, the procedure, and the extremity. Preoperative antibiotics were dosed.  I first attempted to perform a closed reduction of the first tarsometatarsal joint as it  was significantly displaced.  I was unable to move the joints at all and unfortunately felt that I would have  to make an open approach.  He did have notable swelling however I felt that incision would be safe.  I made an incision over the interval between the middle and medial cuneiform as he had significant deformity to his midfoot.  I carried this down through skin and subcutaneous tissue just medial to the EHL.  Here I encountered the anterior tibialis tendon that was incarcerated in the Lisfranc joint and wrapped around the medial cuneiform.  The Lisfranc joint was significantly disrupted with all ligamentous support was gone.  The middle cuneiform had no soft tissue attachment to it.  I felt that with his size being 6" 5" and 315 pounds as well as the significant damage to the joint I felt that fusion primarily would be the best option for him.  I worked to debride the articular cartilage from the first and second tarsometatarsal joints.  I also debrided the cartilage between the middle and medial cuneiform.  The joints were then manipulated into reduction and held while in a K wire for the 4.5 mm cannulated screw set was placed to provisionally hold the reduction.  A K wire was placed from the first metatarsal into the medial cuneiform.  Another was placed transverse across the medial condyle from into the middle cuneiform and getting some purchase into the lateral cuneiform.  Finally 1 guidepin was placed from the medial cuneiform into the second metatarsal and again some fixation into the third metatarsal.  All these K wires were measured and 4.5 mm headless compression Synthes screws were placed.  Excellent fixation was obtained.  Again due to the patient's size and the ligamentous damage to the structures a Titan staple was placed to reinforce the first tarsometatarsal joint fusion.  I then evaluated the fourth and fifth tarsometatarsal joints which were located.  There was impaction of the cuboid  however it was not causing any instability of the fourth and fifth joints.  I felt that a second incision would not add significant benefit for the stability of his midfoot.  The incision was then copiously irrigated.  A gram of vancomycin powder was placed into the wound.  The incision was then closed with 2-0 Vicryl and 3-0 nylon suture.  The tourniquet was dropped.  A Praveena wound VAC was then placed.  It was connected to its canister.  A well-padded short leg splint was then placed.  The patient was then awoken from anesthesia and taken to PACU in stable condition.  Post Op Plan/Instructions: The patient will be nonweightbearing to the right lower extremity.  We will change his dressing on Sunday.  I will obtain a CT scan of his left ankle to further characterize this injury.  He received postoperative Ancef.  He will be on Lovenox for DVT prophylaxis.  I was present and performed the entire surgery.  Truitt Merle, MD [facility]

## 2017-08-03 NOTE — Progress Notes (Signed)
Orthopedic Tech Progress Note Patient Details:  Bosie HelperHarley Greer 05/22/1990 [id number]  Ortho Devices Type of Ortho Device: Post (short leg) splint Ortho Device/Splint Location: rle. applied post reduction at drs request. Ortho Device/Splint Interventions: Ordered, Application   Post Interventions Patient Tolerated: Well Instructions Provided: Care of device, Adjustment of device   Trinna PostMartinez, Ottavio Norem J 08/03/2017, 1:24 AM

## 2017-08-03 NOTE — Anesthesia Preprocedure Evaluation (Addendum)
Anesthesia Evaluation  Patient identified by MRN, date of birth, ID band Patient awake    Reviewed: Allergy & Precautions, NPO status , Patient's Chart, lab work & pertinent test results  Airway Mallampati: II  TM Distance: >3 FB     Dental   Pulmonary neg pulmonary ROS,    breath sounds clear to auscultation       Cardiovascular negative cardio ROS   Rhythm:Regular Rate:Normal     Neuro/Psych    GI/Hepatic negative GI ROS, Neg liver ROS,   Endo/Other  negative endocrine ROS  Renal/GU negative Renal ROS     Musculoskeletal   Abdominal   Peds  Hematology   Anesthesia Other Findings   Reproductive/Obstetrics                             Anesthesia Physical Anesthesia Plan  ASA: II  Anesthesia Plan: General   Post-op Pain Management:    Induction: Intravenous  PONV Risk Score and Plan: Treatment may vary due to age or medical condition, Ondansetron, Dexamethasone and Midazolam  Airway Management Planned: Oral ETT  Additional Equipment:   Intra-op Plan:   Post-operative Plan: Extubation in OR  Informed Consent: I have reviewed the patients History and Physical, chart, labs and discussed the procedure including the risks, benefits and alternatives for the proposed anesthesia with the patient or authorized representative who has indicated his/her understanding and acceptance.   Dental advisory given  Plan Discussed with: CRNA and Anesthesiologist  Anesthesia Plan Comments:         Anesthesia Quick Evaluation

## 2017-08-03 NOTE — Anesthesia Postprocedure Evaluation (Signed)
Anesthesia Post Note  Patient: Jackson Watson  Procedure(s) Performed: OPEN REDUCTION INTERNAL FIXATION (ORIF) FOOT LISFRANC FRACTURE (Right Leg Lower)     Patient location during evaluation: PACU Anesthesia Type: General and Regional Level of consciousness: awake and alert Pain management: pain level controlled Vital Signs Assessment: post-procedure vital signs reviewed and stable Respiratory status: spontaneous breathing, nonlabored ventilation, respiratory function stable and patient connected to nasal cannula oxygen Cardiovascular status: blood pressure returned to baseline and stable Postop Assessment: no apparent nausea or vomiting Anesthetic complications: no    Last Vitals:  Vitals:   08/03/17 1624 08/03/17 1643  BP: 139/73 126/81  Pulse:  90  Resp:  20  Temp: (!) 36.3 C   SpO2:  94%    Last Pain:  Vitals:   08/03/17 1643  TempSrc:   PainSc: Asleep                 Rayshawn Maney,W. EDMOND

## 2017-08-03 NOTE — ED Notes (Signed)
Notified pharmacy for povidone idodine swab

## 2017-08-04 LAB — CBC
HEMATOCRIT: 41.5 % (ref 39.0–52.0)
Hemoglobin: 13.5 g/dL (ref 13.0–17.0)
MCH: 28.4 pg (ref 26.0–34.0)
MCHC: 32.5 g/dL (ref 30.0–36.0)
MCV: 87.2 fL (ref 78.0–100.0)
PLATELETS: 239 10*3/uL (ref 150–400)
RBC: 4.76 MIL/uL (ref 4.22–5.81)
RDW: 13.1 % (ref 11.5–15.5)
WBC: 15.1 10*3/uL — ABNORMAL HIGH (ref 4.0–10.5)

## 2017-08-04 MED ORDER — ENOXAPARIN SODIUM 40 MG/0.4ML ~~LOC~~ SOLN
40.0000 mg | SUBCUTANEOUS | Status: DC
  Administered 2017-08-04 – 2017-08-06 (×3): 40 mg via SUBCUTANEOUS
  Filled 2017-08-04 (×3): qty 0.4

## 2017-08-04 NOTE — Progress Notes (Signed)
Orthopaedic Trauma Progress Note  S: Patient doing okay.  Tried to weight-bear on his left leg but had significant pain where he was not able to do it.  CT scan of his left ankle was performed yesterday evening.  Praveena wound VAC disconnected because of beeping  O:  Vitals:   08/04/17 0845 08/04/17 1238  BP: 128/61 133/73  Pulse: 86 84  Resp:    Temp: 98 F (36.7 C) 98 F (36.7 C)  SpO2: 98% 100%   General: No acute distress, awake alert and oriented x3, cooperative and pleasant. RLE: Splint clean, dry and intact. Able to wiggle toes, sensation intact LLE: Ankle swollen and painful to move. Motor and sensory intact. Intact DP/PT pulse  Imaging: CT scan of left ankle show lateral avulsion of talus. Tibiotalar joint reduced but small loose body fragments in tibiotalar joint  Labs:  CBC    Component Value Date/Time   WBC 15.1 (H) 08/04/2017 0539   RBC 4.76 08/04/2017 0539   HGB 13.5 08/04/2017 0539   HCT 41.5 08/04/2017 0539   PLT 239 08/04/2017 0539   MCV 87.2 08/04/2017 0539   MCH 28.4 08/04/2017 0539   MCHC 32.5 08/04/2017 0539   RDW 13.1 08/04/2017 0539   LYMPHSABS 2.6 08/02/2017 2041   MONOABS 0.8 08/02/2017 2041   EOSABS 0.1 08/02/2017 2041   BASOSABS 0.0 08/02/2017 20429    A/P: 27 year old s/p ORIF of right Lisfranc fx dislocation and with left ankle injury possible dislocation with spontatenous reduction  Patient's left ankle injury likely does not need any fixation however he may benefit from an arthrotomy or ankle arthroscopy.  I have reached out to Dr. Glenford Bayley and will continue weightbearing as tolerated in the left leg in the boot until make a determination.  Weightbearing: NWB RLE, WBAT LLE Insicional and dressing care: Incisional wound vac to right leg, connect to wall suction, splint clean, dry and intact Orthopedic device(s): Boot to LLE Showering: Not yet VTE prophylaxis: Lovenox for VTE Pain control: oxycodone and robaxin Follow - up plan:  TBD   [name], MD [facility] [phone] (phone)

## 2017-08-04 NOTE — Progress Notes (Addendum)
OT Cancellation Note  Patient Details Name: Jackson Watson MRN: [id number] DOB: [birth year]   Cancelled Treatment:    Reason Eval/Treat Not Completed: Pain limiting ability to participate. Pt with significant increase in R LE pain and unable to participate in session at this time. Reports throbbing pain that began when wound vac disconnected earlier this date and pt grimacing groaning throughout conversation. Notified RN who reports pt not due for pain medications but she will continue to monitor. Will check back as appropriate.   Doristine Section, MS OTR/L  Pager: [phone]    Doristine Section 08/04/2017, 12:27 PM

## 2017-08-04 NOTE — Progress Notes (Signed)
Orthopedic Tech Progress Note Patient Details:  Jackson Watson [birth year] [id number]  Ortho Devices Type of Ortho Device: CAM walker Ortho Device/Splint Location: lle Ortho Device/Splint Interventions: Application   Post Interventions Patient Tolerated: Well Instructions Provided: Care of device   Nikki Dom 08/04/2017, 9:39 AM

## 2017-08-04 NOTE — Plan of Care (Signed)
  Problem: Nutrition: Goal: Adequate nutrition will be maintained Outcome: Progressing   Problem: Elimination: Goal: Will not experience complications related to bowel motility Outcome: Progressing   Problem: Pain Managment: Goal: General experience of comfort will improve Outcome: Progressing   Problem: Safety: Goal: Ability to remain free from injury will improve Outcome: Progressing   

## 2017-08-04 NOTE — Progress Notes (Signed)
Attached negative pressure dressing tube to continuous wall suction at per MD.

## 2017-08-04 NOTE — Progress Notes (Signed)
Physical Therapy Evaluation Patient Details Name: Jackson Watson MRN: [id number] DOB: 11/17/1990 Today's Date: 08/04/2017   History of Present Illness  Jackson Watson is a 27 y/o male admitted on 08/02/17 after jumping from tractor with resultant R Lisfranc fracture dislocation as well as L ankle/foot injury. ORIF performed on 08/03/17 with patient NWB on R LE. Cam boot and WBAT orders for L LE. No significant PMH.  Clinical Impression  Patient presenting with the above listed diagnosis. Per patient reports, PTA  Independent with all functional mobility. Patient requiring physical assist for all mobility and transfers today, with pain primary limiting factor for continued transfers training - only able to tolerate static stance for ~ 10 seconds on L LE + Cam boot. Due to current pain levels and limited tolerance, likely to need w/c for primary means of mobility, but will continue to assess during this admission. PT to continue to follow acutely to maximize functional mobility prior to d/c.     Follow Up Recommendations Follow surgeon's recommendation for DC plan and follow-up therapies    Equipment Recommendations  Wheelchair (measurements PT)(will continue to assess based on tolerance to weight bearing)    Recommendations for Other Services OT consult     Precautions / Restrictions Precautions Precautions: Fall Restrictions Weight Bearing Restrictions: Yes RLE Weight Bearing: Non weight bearing LLE Weight Bearing: Weight bearing as tolerated(+ CAM boot)      Mobility  Bed Mobility Overal bed mobility: Needs Assistance Bed Mobility: Supine to Sit;Sit to Supine     Supine to sit: Supervision Sit to supine: Min guard;Min assist   General bed mobility comments: able to come to EOB with PT managing lines and tubes. Does require Min A returning to supine for sequencing and positioning  Transfers Overall transfer level: Needs assistance Equipment used: Rolling walker (2  wheeled) Transfers: Sit to/from Stand Sit to Stand: Mod assist;From elevated surface         General transfer comment: pain limiting transfers - only able to perform static stance for ~10 seconds due to pain at L ankle.  Ambulation/Gait             General Gait Details: deferred  Stairs            Wheelchair Mobility    Modified Rankin (Stroke Patients Only)       Balance Overall balance assessment: Needs assistance Sitting-balance support: Feet unsupported;Bilateral upper extremity supported Sitting balance-Leahy Scale: Fair     Standing balance support: Bilateral upper extremity supported;During functional activity Standing balance-Leahy Scale: Poor                               Pertinent Vitals/Pain Pain Assessment: 0-10 Pain Score: 7  Pain Location: B ankle and feet Pain Descriptors / Indicators: Aching;Discomfort;Grimacing;Guarding Pain Intervention(s): Limited activity within patient's tolerance;Monitored during session;Repositioned    Home Living Family/patient expects to be discharged to:: Private residence Living Arrangements: Alone;Children Available Help at Discharge: Family Type of Home: House Home Access: (1 step to enter)     Home Layout: One level Home Equipment: None      Prior Function Level of Independence: Independent         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   Dominant Hand: Left    Extremity/Trunk Assessment   Upper Extremity Assessment Upper Extremity Assessment: Defer to OT evaluation    Lower Extremity Assessment Lower Extremity Assessment:  Generalized weakness;RLE deficits/detail;LLE deficits/detail RLE Deficits / Details: R ankle immobilized + wound vac placement LLE Deficits / Details: L LE swollen and with reduced AROM due to pain    Cervical / Trunk Assessment Cervical / Trunk Assessment: Normal  Communication   Communication: No difficulties  Cognition  Arousal/Alertness: Awake/alert Behavior During Therapy: WFL for tasks assessed/performed Overall Cognitive Status: Within Functional Limits for tasks assessed                                        General Comments      Exercises     Assessment/Plan    PT Assessment Patient needs continued PT services  PT Problem List Decreased strength;Decreased range of motion;Decreased activity tolerance;Decreased balance;Decreased mobility;Decreased knowledge of use of DME;Decreased safety awareness;Pain       PT Treatment Interventions DME instruction;Gait training;Functional mobility training;Therapeutic activities;Therapeutic exercise;Patient/family education;Wheelchair mobility training    PT Goals (Current goals can be found in the Care Plan section)  Acute Rehab PT Goals Patient Stated Goal: return home, improve pain PT Goal Formulation: With patient Time For Goal Achievement: 08/18/17 Potential to Achieve Goals: Good    Frequency Min 5X/week   Barriers to discharge        Co-evaluation               AM-PAC PT "6 Clicks" Daily Activity  Outcome Measure Difficulty turning over in bed (including adjusting bedclothes, sheets and blankets)?: A Lot Difficulty moving from lying on back to sitting on the side of the bed? : A Little Difficulty sitting down on and standing up from a chair with arms (e.g., wheelchair, bedside commode, etc,.)?: Unable Help needed moving to and from a bed to chair (including a wheelchair)?: A Lot Help needed walking in hospital room?: Total Help needed climbing 3-5 steps with a railing? : Total 6 Click Score: 10    End of Session Equipment Utilized During Treatment: Gait belt Activity Tolerance: Patient limited by pain Patient left: in bed;with call bell/phone within reach Nurse Communication: Mobility status PT Visit Diagnosis: Unsteadiness on feet (R26.81);Other abnormalities of gait and mobility (R26.89);Difficulty in walking,  not elsewhere classified (R26.2);Muscle weakness (generalized) (M62.81)    Time: 0940-1007 PT Time Calculation (min) (ACUTE ONLY): 27 min   Charges:   PT Evaluation $PT Eval Low Complexity: 1 Low     PT G Codes:   PT G-Codes **NOT FOR INPATIENT CLASS** Functional Assessment Tool Used: AM-PAC 6 Clicks Basic Mobility Functional Limitation: Mobility: Walking and moving around Mobility: Walking and Moving Around Current Status (W1191): At least 60 percent but less than 80 percent impaired, limited or restricted Mobility: Walking and Moving Around Goal Status [id number]): At least 80 percent but less than 100 percent impaired, limited or restricted    Kipp Laurence, PT, DPT 08/04/17 11:07 AM

## 2017-08-05 ENCOUNTER — Other Ambulatory Visit: Payer: Self-pay

## 2017-08-05 MED ORDER — OXYCODONE HCL 5 MG PO TABS
15.0000 mg | ORAL_TABLET | ORAL | Status: DC | PRN
Start: 2017-08-05 — End: 2017-08-07
  Administered 2017-08-05 – 2017-08-06 (×4): 15 mg via ORAL
  Filled 2017-08-05 (×4): qty 3

## 2017-08-05 MED ORDER — KETOROLAC TROMETHAMINE 15 MG/ML IJ SOLN
15.0000 mg | Freq: Four times a day (QID) | INTRAMUSCULAR | Status: DC
  Administered 2017-08-05 – 2017-08-08 (×12): 15 mg via INTRAVENOUS
  Filled 2017-08-05 (×12): qty 1

## 2017-08-05 NOTE — Progress Notes (Signed)
Orthopaedic Trauma Progress Note  S: Continues to have pain, getting slightly better. Parents at bedside this AM  O:  Vitals:   08/04/17 2115 08/05/17 0432  BP: 132/78 138/78  Pulse: 86 84  Resp:    Temp: 99.2 F (37.3 C) 98.9 F (37.2 C)  SpO2: 97% 94%   General: No acute distress, awake alert and oriented x3, cooperative and pleasant. RLE: Splint clean, dry and intact. Able to wiggle toes, sensation intact. Dressing taken down and incisions clean, dry and intact LLE: Ankle swollen and painful to move. Motor and sensory intact. Intact DP/PT pulse  Imaging: CT scan of left ankle show lateral avulsion of talus. Tibiotalar joint reduced but small loose body fragments in tibiotalar joint  Labs:  CBC    Component Value Date/Time   WBC 15.1 (H) 08/04/2017 0539   RBC 4.76 08/04/2017 0539   HGB 13.5 08/04/2017 0539   HCT 41.5 08/04/2017 0539   PLT 239 08/04/2017 0539   MCV 87.2 08/04/2017 0539   MCH 28.4 08/04/2017 0539   MCHC 32.5 08/04/2017 0539   RDW 13.1 08/04/2017 0539   LYMPHSABS 2.6 08/02/2017 2041   MONOABS 0.8 08/02/2017 2041   EOSABS 0.1 08/02/2017 2041   BASOSABS 0.0 08/02/2017 20488    A/P: 27 year old s/p ORIF of right Lisfranc fx dislocation and with left ankle injury possible dislocation with spontatenous reduction  Patient's left ankle injury likely does not need any fixation however he may benefit from an arthrotomy or ankle arthroscopy.  I have reached out to Dr. Victorino Dike and will continue weightbearing as tolerated in the left leg in the boot until make a determination.  Weightbearing: NWB RLE, WBAT LLE Insicional and dressing care: Adaptic, 4x4s and kerlix to right leg, connect to wall suction, splint clean, dry and intact Orthopedic device(s): Boot to LLE Showering: Not yet VTE prophylaxis: Lovenox for VTE Pain control: oxycodone and robaxin, added toradol this AM  Follow - up plan: TBD   [name], MD [facility] [phone] (phone)

## 2017-08-05 NOTE — Evaluation (Signed)
Occupational Therapy Evaluation Patient Details Name: Jackson Watson MRN: [id number] DOB: 1990-08-22 Today's Date: 08/05/2017    History of Present Illness Mr. Eppinger is a 27 y/o male admitted on 08/02/17 after jumping from tractor with resultant R Lisfranc fracture dislocation as well as L ankle/foot injury. ORIF performed on 08/03/17 with patient NWB on R LE. Cam boot and WBAT orders for L LE. No significant PMH.   Clinical Impression   PATIENT WAS COOPERATIVE DURING SESSION BUT WAS LIMITED BY PAIN. PATIENT WAS UNABLE TO TRANSFER WITH ONE PERSON ASSIST. PATIENT WAS ABLE TO STAND FROM ELEVATED BED WITH WALKER FOR LESS THAN A MINUTE. PATIENT WILL NEED CONTINUED OT SERVICES.     Follow Up Recommendations  Home health OT    Equipment Recommendations  3 in 1 bedside commode    Recommendations for Other Services       Precautions / Restrictions Precautions Precautions: Fall Restrictions RLE Weight Bearing: Non weight bearing LLE Weight Bearing: Weight bearing as tolerated      Mobility Bed Mobility         Supine to sit: Supervision Sit to supine: Min guard;Min assist   General bed mobility comments: assist with lines and tubes  Transfers     Transfers: Sit to/from Stand Sit to Stand: Mod assist;From elevated surface         General transfer comment: pain limiting transfers - only able to perform static stance for ~10 seconds due to pain at L ankle.    Balance                                           ADL either performed or assessed with clinical judgement   ADL Overall ADL's : Needs assistance/impaired Eating/Feeding: Independent   Grooming: Wash/dry hands;Wash/dry face;Set up   Upper Body Bathing: Supervision/ safety;Set up;Sitting   Lower Body Bathing: Maximal assistance;Sit to/from stand   Upper Body Dressing : Minimal assistance;Sitting   Lower Body Dressing: Total assistance;Sit to/from stand               Functional  mobility during ADLs: (sit to stand mod a from elevated bed. Pt. unable to transfer) General ADL Comments: Patient requires extensive assist currently for LE adls and was not able to transfer patient with 1 person assist today. Pt. was able to stand for 40-50 seconds with walker.      Vision Baseline Vision/History: No visual deficits Patient Visual Report: No change from baseline       Perception     Praxis      Pertinent Vitals/Pain Pain Assessment: 0-10 Pain Score: 5  Pain Location: l ankle and r foot Pain Descriptors / Indicators: Pressure;Sharp Pain Intervention(s): Limited activity within patient's tolerance;Monitored during session;Premedicated before session     Hand Dominance Left   Extremity/Trunk Assessment Upper Extremity Assessment Upper Extremity Assessment: Overall WFL for tasks assessed           Communication Communication Communication: No difficulties   Cognition Arousal/Alertness: Awake/alert Behavior During Therapy: WFL for tasks assessed/performed Overall Cognitive Status: Within Functional Limits for tasks assessed                                     General Comments       Exercises     Shoulder Instructions  Home Living Family/patient expects to be discharged to:: Private residence Living Arrangements: Spouse/significant other;Children Available Help at Discharge: Family Type of Home: House Home Access: Stairs to enter Secretary/administrator of Steps: 2   Home Layout: One level     Bathroom Shower/Tub: Producer, television/film/video: Standard Bathroom Accessibility: Yes   Home Equipment: None          Prior Functioning/Environment Level of Independence: Independent        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        OT Problem List: Decreased activity tolerance;Decreased knowledge of use of DME or AE;Pain      OT Treatment/Interventions: Self-care/ADL training;Therapeutic exercise;DME and/or  AE instruction;Therapeutic activities;Patient/family education    OT Goals(Current goals can be found in the care plan section) Acute Rehab OT Goals Patient Stated Goal: go home OT Goal Formulation: With patient Time For Goal Achievement: 08/19/17 Potential to Achieve Goals: Good  OT Frequency: Min 2X/week   Barriers to D/C:            Co-evaluation              AM-PAC PT "6 Clicks" Daily Activity     Outcome Measure Help from another person eating meals?: None Help from another person taking care of personal grooming?: A Little Help from another person toileting, which includes using toliet, bedpan, or urinal?: Total Help from another person bathing (including washing, rinsing, drying)?: A Lot Help from another person to put on and taking off regular upper body clothing?: A Little Help from another person to put on and taking off regular lower body clothing?: Total 6 Click Score: 14   End of Session Equipment Utilized During Treatment: Gait belt;Rolling walker Nurse Communication: (ok treatment)  Activity Tolerance: Patient limited by pain Patient left: in bed;with call bell/phone within reach;with family/visitor present  OT Visit Diagnosis: Unsteadiness on feet (R26.81);Other abnormalities of gait and mobility (R26.89)                Time: 4098-1191 OT Time Calculation (min): 30 min Charges:  OT General Charges $OT Visit: 1 Visit OT Evaluation $OT Eval Low Complexity: 1 Low OT Treatments $Self Care/Home Management : 8-22 mins G-Codes:     6 CLICKS  Mayra Jolliffe 08/05/2017, 9:50 AM

## 2017-08-05 NOTE — Progress Notes (Signed)
Physical Therapy Treatment Patient Details Name: Jackson Watson MRN: [id number] DOB: June 21, 1990 Today's Date: 08/05/2017    History of Present Illness Jackson Watson is a 27 y/o male admitted on 08/02/17 after jumping from tractor with resultant R Lisfranc fracture dislocation as well as L ankle/foot injury. ORIF performed on 08/03/17 with patient NWB on R LE. Cam boot and WBAT orders for L LE. No significant PMH.    PT Comments    Patient progressing slowly towards goals. Continues to be limited by pain but is very agreeable and motivated for therapy. Able to progress to transferring bed to chair with two person moderate assistance using RW. Patient is very unsteady and currently at risk for falls. Recommend assessment tomorrow of performing a low pivot from bed to drop arm chair since patient will likely be at wheelchair level at home. If unsafe with low pivot, may need to consider updating discharge plan.   Follow Up Recommendations  Home health PT;Supervision for mobility/OOB     Equipment Recommendations  Wheelchair (measurements PT);Wheelchair cushion (measurements PT);Rolling walker with 5" wheels(Elevating legrests)    Recommendations for Other Services       Precautions / Restrictions Precautions Precautions: Fall Required Braces or Orthoses: Other Brace/Splint Other Brace/Splint: LLE CAM boot Restrictions Weight Bearing Restrictions: Yes RLE Weight Bearing: Non weight bearing LLE Weight Bearing: Weight bearing as tolerated    Mobility  Bed Mobility Overal bed mobility: Needs Assistance Bed Mobility: Supine to Sit     Supine to sit: Supervision     General bed mobility comments: Supervision for safety  Transfers Overall transfer level: Needs assistance Equipment used: Rolling walker (2 wheeled) Transfers: Sit to/from [employer] Sit to Stand: Mod assist;From elevated surface;+2 safety/equipment;+2 physical assistance Stand pivot transfers: Mod  assist;+2 physical assistance;+2 safety/equipment       General transfer comment: Able to successfully perform sit to stand on 2nd attempt with modA + 2 to boost up from elevated bed to RW. Stand pivot with 2 person moderate assistance using RW. Patient very unsteady and initially attempted to hop instead of pivoting; L foot went forward past frame of RW causing anterior weight shift. Needs physical assistance to turn RW. Visual and verbal cueing for sequencing. Controlled lowering down onto recliner; patient unable to reach back for armrests  Ambulation/Gait                 Stairs             Wheelchair Mobility    Modified Rankin (Stroke Patients Only)       Balance Overall balance assessment: Needs assistance Sitting-balance support: Feet unsupported;Bilateral upper extremity supported Sitting balance-Leahy Scale: Good     Standing balance support: Bilateral upper extremity supported Standing balance-Leahy Scale: Poor Standing balance comment: reliant on BUE support. RLE NWB                            Cognition Arousal/Alertness: Awake/alert Behavior During Therapy: WFL for tasks assessed/performed Overall Cognitive Status: Within Functional Limits for tasks assessed                                        Exercises General Exercises - Lower Extremity Quad Sets: 10 reps;Both;Supine Other Exercises Other Exercises: Encouraged toe flex/extension for increased circulation    General Comments  Pertinent Vitals/Pain Pain Assessment: Faces Faces Pain Scale: Hurts whole lot Pain Location: L ankle and R foot Pain Descriptors / Indicators: Pressure;Sharp Pain Intervention(s): Limited activity within patient's tolerance;Monitored during session;Premedicated before session    Home Living                      Prior Function            PT Goals (current goals can now be found in the care plan section) Acute Rehab  PT Goals Patient Stated Goal: go home PT Goal Formulation: With patient Time For Goal Achievement: 08/18/17 Potential to Achieve Goals: Good Progress towards PT goals: Progressing toward goals    Frequency    Min 5X/week      PT Plan Current plan remains appropriate    Co-evaluation              AM-PAC PT "6 Clicks" Daily Activity  Outcome Measure  Difficulty turning over in bed (including adjusting bedclothes, sheets and blankets)?: A Lot Difficulty moving from lying on back to sitting on the side of the bed? : A Little Difficulty sitting down on and standing up from a chair with arms (e.g., wheelchair, bedside commode, etc,.)?: Unable Help needed moving to and from a bed to chair (including a wheelchair)?: A Lot Help needed walking in hospital room?: Total Help needed climbing 3-5 steps with a railing? : Total 6 Click Score: 10    End of Session Equipment Utilized During Treatment: Gait belt Activity Tolerance: Patient limited by pain Patient left: with call bell/phone within reach;in chair Nurse Communication: Mobility status PT Visit Diagnosis: Unsteadiness on feet (R26.81);Other abnormalities of gait and mobility (R26.89);Difficulty in walking, not elsewhere classified (R26.2);Muscle weakness (generalized) (M62.81)     Time: 1212-1229 PT Time Calculation (min) (ACUTE ONLY): 17 min  Charges:  $Therapeutic Activity: 8-22 mins                    G Codes:      Jackson Watson, PT, DPT Acute Rehabilitation Services  Pager: [phone]    Jackson Watson 08/05/2017, 2:45 PM

## 2017-08-06 ENCOUNTER — Encounter (HOSPITAL_COMMUNITY): Payer: Self-pay | Admitting: Student

## 2017-08-06 ENCOUNTER — Other Ambulatory Visit: Payer: Self-pay | Admitting: Orthopedic Surgery

## 2017-08-06 NOTE — Plan of Care (Signed)
  Problem: Health Behavior/Discharge Planning: Goal: Ability to manage health-related needs will improve Outcome: Progressing   Problem: Clinical Measurements: Goal: Ability to maintain clinical measurements within normal limits will improve Outcome: Progressing   Problem: Elimination: Goal: Will not experience complications related to bowel motility Outcome: Progressing   

## 2017-08-06 NOTE — Plan of Care (Signed)
?  Problem: Elimination: ?Goal: Will not experience complications related to bowel motility ?Outcome: Progressing ?  ?Problem: Pain Managment: ?Goal: General experience of comfort will improve ?Outcome: Progressing ?  ?Problem: Safety: ?Goal: Ability to remain free from injury will improve ?Outcome: Progressing ?  ?

## 2017-08-06 NOTE — Progress Notes (Signed)
Physical Therapy Treatment Patient Details Name: Jackson Watson MRN: [id number] DOB: 12-01-1990 Today's Date: 08/06/2017    History of Present Illness Jackson Watson is a 27 y/o male admitted on 08/02/17 after jumping from tractor with resultant R Lisfranc fracture dislocation as well as L ankle/foot injury. ORIF performed on 08/03/17 with patient NWB on R LE. Cam boot and WBAT orders for L LE. No significant PMH.    PT Comments    Patient with much improved pain control this session. Patient is progressing very well towards their physical therapy goals. Trialed low pivot from bed to chair (towards left side) this session. Patient with increased independence and decreased pain with low pivot transfer. Highly recommending patient perform low pivots versus stand pivots with RW at home from bed <> wheelchair for safety. Practiced sit to stands from recliner; patient requiring moderate assist + 2 to steady once standing. Continue to practice low pivot transfers towards both sides. PT provided verbal instructions for car transfer.     Follow Up Recommendations  Home health PT;Supervision for mobility/OOB     Equipment Recommendations  Wheelchair (measurements PT);Wheelchair cushion (measurements PT);Rolling walker with 5" wheels(elevating legrests)    Recommendations for Other Services       Precautions / Restrictions Precautions Precautions: Fall Required Braces or Orthoses: Other Brace/Splint Other Brace/Splint: LLE CAM boot Restrictions Weight Bearing Restrictions: Yes RLE Weight Bearing: Non weight bearing LLE Weight Bearing: Weight bearing as tolerated    Mobility  Bed Mobility Overal bed mobility: Needs Assistance Bed Mobility: Supine to Sit     Supine to sit: Supervision     General bed mobility comments: Supervision for safety  Transfers Overall transfer level: Needs assistance Equipment used: Rolling walker (2 wheeled) Transfers: Systems analyst;Sit to/from Stand Sit  to Stand: Mod assist;+2 physical assistance;+2 safety/equipment   Squat pivot transfers: Min guard     General transfer comment: Patient able to perform low pivot from bed to chair towards left side with multiple scoots and min guard assist. Needs mod assist for sit to stand from recliner to RW in order to steady  Ambulation/Gait                 Stairs             Wheelchair Mobility    Modified Rankin (Stroke Patients Only)       Balance Overall balance assessment: Needs assistance Sitting-balance support: Feet unsupported;Bilateral upper extremity supported Sitting balance-Leahy Scale: Good     Standing balance support: Bilateral upper extremity supported Standing balance-Leahy Scale: Poor Standing balance comment: reliant on BUE support. RLE NWB                            Cognition Arousal/Alertness: Awake/alert Behavior During Therapy: WFL for tasks assessed/performed Overall Cognitive Status: Within Functional Limits for tasks assessed                                        Exercises Other Exercises Other Exercises: Reviewed quad sets    General Comments        Pertinent Vitals/Pain Pain Assessment: 0-10 Pain Score: 5 (with standing) Pain Location: B ANKLE Pain Descriptors / Indicators: Sharp;Throbbing Pain Intervention(s): Limited activity within patient's tolerance;Monitored during session;Premedicated before session    Home Living  Prior Function            PT Goals (current goals can now be found in the care plan section) Acute Rehab PT Goals Patient Stated Goal: GO HOME PT Goal Formulation: With patient Time For Goal Achievement: 08/18/17 Potential to Achieve Goals: Good Progress towards PT goals: Progressing toward goals    Frequency    Min 5X/week      PT Plan Current plan remains appropriate    Co-evaluation PT/OT/SLP Co-Evaluation/Treatment: Yes Reason for  Co-Treatment: For patient/therapist safety PT goals addressed during session: Mobility/safety with mobility        AM-PAC PT "6 Clicks" Daily Activity  Outcome Measure  Difficulty turning over in bed (including adjusting bedclothes, sheets and blankets)?: A Lot Difficulty moving from lying on back to sitting on the side of the bed? : A Little Difficulty sitting down on and standing up from a chair with arms (e.g., wheelchair, bedside commode, etc,.)?: Unable Help needed moving to and from a bed to chair (including a wheelchair)?: A Little Help needed walking in hospital room?: Total Help needed climbing 3-5 steps with a railing? : Total 6 Click Score: 11    End of Session Equipment Utilized During Treatment: Gait belt Activity Tolerance: Patient tolerated treatment well Patient left: with call bell/phone within reach;in chair Nurse Communication: Mobility status PT Visit Diagnosis: Unsteadiness on feet (R26.81);Other abnormalities of gait and mobility (R26.89);Difficulty in walking, not elsewhere classified (R26.2);Muscle weakness (generalized) (M62.81)     Time: 0981-1914 PT Time Calculation (min) (ACUTE ONLY): 15 min  Charges:  $Therapeutic Activity: 8-22 mins                    G Codes:      Laurina Bustle, PT, DPT Acute Rehabilitation Services  Pager: [phone]  Vanetta Mulders 08/06/2017, 1:24 PM

## 2017-08-06 NOTE — Progress Notes (Signed)
Orthopaedic Trauma Progress Note  S: No issues overnight  O:  Vitals:   08/05/17 1356 08/05/17 2027  BP: 124/74 140/78  Pulse: 85 86  Resp: 18 20  Temp: 98.9 F (37.2 C) 99.2 F (37.3 C)  SpO2: 99% 98%   General: No acute distress, awake alert and oriented x3, cooperative and pleasant. RLE: Splint clean, dry and intact. Able to wiggle toes, sensation intact. LLE: Ankle swollen and painful to move. Motor and sensory intact. Intact DP/PT pulse  Imaging: CT scan of left ankle show lateral avulsion of talus. Tibiotalar joint reduced but small loose body fragments in tibiotalar joint  Labs:  CBC    Component Value Date/Time   WBC 15.1 (H) 08/04/2017 0539   RBC 4.76 08/04/2017 0539   HGB 13.5 08/04/2017 0539   HCT 41.5 08/04/2017 0539   PLT 239 08/04/2017 0539   MCV 87.2 08/04/2017 0539   MCH 28.4 08/04/2017 0539   MCHC 32.5 08/04/2017 0539   RDW 13.1 08/04/2017 0539   LYMPHSABS 2.6 08/02/2017 2041   MONOABS 0.8 08/02/2017 2041   EOSABS 0.1 08/02/2017 2041   BASOSABS 0.0 08/02/2017 20414    A/P: 27 year old s/p ORIF of right Lisfranc fx dislocation and with left ankle injury possible dislocation with spontatenous reduction  Plan for arthroscopic I&D with Dr. Victorino Dike tomorrow afternoon. Likely discharge home Wednesday AM.  Weightbearing: NWB RLE, WBAT LLE Insicional and dressing care: Adaptic, 4x4s and kerlix to right legsplint clean, dry and intact Orthopedic device(s): Boot to LLE Showering: Not yet VTE prophylaxis: Lovenox for VTE Pain control: oxycodone and robaxin, added toradol Follow - up plan: TBD   [name], MD [facility] [phone] (phone)

## 2017-08-06 NOTE — Progress Notes (Signed)
Occupational Therapy Treatment Patient Details Name: Jackson Watson MRN: [id number] DOB: [birth year] Today's Date: 08/06/2017    History of present illness Jackson Watson is a 27 y/o male admitted on 08/02/17 after jumping from tractor with resultant R Lisfranc fracture dislocation as well as L ankle/foot injury. ORIF performed on 08/03/17 with patient NWB on R LE. Cam boot and WBAT orders for L LE. No significant PMH.   OT comments  PATIENT WAS ABLE TO DON SHORTS OVER B FEET WITH MIN A. PATIENT WAS ABLE TO ROCK SIDE TO SIDE TO DON OVER HIPS. PATIENT WAS EDUCATED THAT HE WILL NEED A DROP ARM COMMODE AND PNT WAS ABLE TO TRANSFER BED TO DROP ARM RECLINER WITH MIN GUARD ASSIST. ACUTE OT TO FOLLOW FOR ADLS AND TRANSFER TRAINING. PATIENT IS AN AGREEMENT WITH [facility].  Follow Up Recommendations  Home health OT    Equipment Recommendations  3 in 1 bedside commode(DROP ARM)    Recommendations for Other Services      Precautions / Restrictions Precautions Precautions: Fall Required Braces or Orthoses: Other Brace/Splint Other Brace/Splint: LLE CAM boot Restrictions RLE Weight Bearing: Non weight bearing LLE Weight Bearing: Weight bearing as tolerated       Mobility Bed Mobility         Supine to sit: Supervision        Transfers       Sit to Stand: Mod assist;+2 physical assistance;+2 safety/equipment              Balance                                           ADL either performed or assessed with clinical judgement   ADL                       Lower Body Dressing: Minimal assistance(MIN A WITH DONNING SHORTS OVER B LE AND LEANING TO PULL UP)               Functional mobility during ADLs: Moderate assistance;+2 for safety/equipment;Rolling walker(MIN GUARD WITH SIDE SCOOT BED TO DROP ARM RECLINER) General ADL Comments: DISCUSSED WITH PNT THAT IT WILL BE EASIER TO WEAR WIDE LEG SHORTS TO GET OVER CAST AND CAM BOOT. DISCUSSED THAT PATIENT WILL  WITHER NEED TO USE 3-1 FOR SHOWER OR PURCHASE SHOWER SEAT FOR WALK IN [place]. PATIENT IS AGREEABLE TO HOME HEALTH WHO WILL ADDRESS SHOWER TRANSFERS. DISCUSSED WITH PNT THAT HE WILL NEED DROP ARM COMMODE FOR TOILETING AND IS IN AGREEMENT.      Vision       Perception     Praxis      Cognition Arousal/Alertness: Awake/alert Behavior During Therapy: WFL for tasks assessed/performed Overall Cognitive Status: Within Functional Limits for tasks assessed                                          Exercises     Shoulder Instructions       General Comments      Pertinent Vitals/ Pain       Pain Assessment: 0-10 Pain Score: 5 (5 with standing, 3 sitting in chair and 1-2 laying in bed.) Pain Location: B ANKLE Pain Descriptors / Indicators: Sharp;Throbbing Pain Intervention(s): Limited activity within patient's tolerance;Premedicated before  session  Home Living                                          Prior Functioning/Environment              Frequency  Min 2X/week        Progress Toward Goals  OT Goals(current goals can now be found in the care plan section)  Progress towards OT goals: Progressing toward goals  Acute Rehab OT Goals Patient Stated Goal: GO HOME OT Goal Formulation: With patient  Plan      Co-evaluation    PT/OT/SLP Co-Evaluation/Treatment: Yes Reason for Co-Treatment: For patient/therapist safety   OT goals addressed during session: ADL's and self-care      AM-PAC PT "6 Clicks" Daily Activity     Outcome Measure   Help from another person eating meals?: None Help from another person taking care of personal grooming?: A Little Help from another person toileting, which includes using toliet, bedpan, or urinal?: A Lot Help from another person bathing (including washing, rinsing, drying)?: A Lot Help from another person to put on and taking off regular upper body clothing?: A Little Help from another  person to put on and taking off regular lower body clothing?: A Lot 6 Click Score: 16    End of Session Equipment Utilized During Treatment: Gait belt;Rolling walker  OT Visit Diagnosis: Unsteadiness on feet (R26.81);Pain   Activity Tolerance Patient limited by pain   Patient Left in chair;with call bell/phone within reach   Nurse Communication (OK TREATMENT)        Time: 4098-1191 OT Time Calculation (min): 25 min  Charges: OT General Charges $OT Visit: 1 Visit OT Treatments $Self Care/Home Management : 8-22 mins $Therapeutic Activity: 8-22 mins  6 CLICKS   Jackson Watson 08/06/2017, 9:55 AM

## 2017-08-07 ENCOUNTER — Observation Stay (HOSPITAL_COMMUNITY): Payer: BLUE CROSS/BLUE SHIELD | Admitting: Anesthesiology

## 2017-08-07 ENCOUNTER — Encounter (HOSPITAL_COMMUNITY): Payer: Self-pay

## 2017-08-07 ENCOUNTER — Encounter (HOSPITAL_COMMUNITY)
Admission: EM | Disposition: A | Discharge status: Home / Self Care | Payer: Self-pay | Source: Home / Self Care | Attending: Emergency Medicine

## 2017-08-07 HISTORY — PX: ANKLE ARTHROSCOPY: SHX545

## 2017-08-07 LAB — SURGICAL PCR SCREEN
MRSA, PCR: NEGATIVE
Staphylococcus aureus: NEGATIVE

## 2017-08-07 LAB — GLUCOSE, CAPILLARY: GLUCOSE-CAPILLARY: 83 mg/dL (ref 65–99)

## 2017-08-07 SURGERY — ARTHROSCOPY, ANKLE
Anesthesia: General | Site: Ankle | Laterality: Left

## 2017-08-07 MED ORDER — ACETAMINOPHEN 325 MG PO TABS: 325.0000 mg | ORAL_TABLET | Freq: Four times a day (QID) | ORAL | Status: DC | PRN

## 2017-08-07 MED ORDER — MIDAZOLAM HCL 2 MG/2ML IJ SOLN
INTRAMUSCULAR | Status: AC
  Administered 2017-08-07: 2 mg
  Filled 2017-08-07: qty 2

## 2017-08-07 MED ORDER — POVIDONE-IODINE 10 % EX SWAB
2.0000 "application " | Freq: Once | CUTANEOUS | Status: DC
  Administered 2017-08-07: 2 via TOPICAL

## 2017-08-07 MED ORDER — PROPOFOL 10 MG/ML IV BOLUS
INTRAVENOUS | Status: AC
  Filled 2017-08-07: qty 20

## 2017-08-07 MED ORDER — DEXAMETHASONE SODIUM PHOSPHATE 10 MG/ML IJ SOLN
INTRAMUSCULAR | Status: DC | PRN
  Administered 2017-08-07: 5 mg via INTRAVENOUS

## 2017-08-07 MED ORDER — HYDROMORPHONE HCL 2 MG/ML IJ SOLN: 0.3000 mg | INTRAMUSCULAR | Status: DC | PRN

## 2017-08-07 MED ORDER — MIDAZOLAM HCL 2 MG/2ML IJ SOLN
INTRAMUSCULAR | Status: AC
Start: 2017-08-07 — End: ?
  Filled 2017-08-07: qty 2

## 2017-08-07 MED ORDER — OXYCODONE HCL 15 MG PO TABS: 15.0000 mg | ORAL_TABLET | ORAL | 0 refills | Status: AC | PRN

## 2017-08-07 MED ORDER — ENOXAPARIN SODIUM 40 MG/0.4ML ~~LOC~~ SOLN
40.0000 mg | SUBCUTANEOUS | Status: DC
  Administered 2017-08-08: 40 mg via SUBCUTANEOUS
  Filled 2017-08-07: qty 0.4

## 2017-08-07 MED ORDER — ONDANSETRON HCL 4 MG PO TABS: 4.0000 mg | ORAL_TABLET | Freq: Four times a day (QID) | ORAL | Status: DC | PRN

## 2017-08-07 MED ORDER — LIDOCAINE 2% (20 MG/ML) 5 ML SYRINGE
INTRAMUSCULAR | Status: DC | PRN
  Administered 2017-08-07: 60 mg via INTRAVENOUS

## 2017-08-07 MED ORDER — CEFAZOLIN SODIUM 1 G IJ SOLR
INTRAMUSCULAR | Status: AC
  Filled 2017-08-07: qty 10

## 2017-08-07 MED ORDER — PROMETHAZINE HCL 25 MG/ML IJ SOLN: 6.2500 mg | INTRAMUSCULAR | Status: DC | PRN

## 2017-08-07 MED ORDER — BUPIVACAINE-EPINEPHRINE (PF) 0.5% -1:200000 IJ SOLN
INTRAMUSCULAR | Status: DC | PRN
  Administered 2017-08-07: 40 mL via PERINEURAL

## 2017-08-07 MED ORDER — BUPIVACAINE HCL (PF) 0.5 % IJ SOLN
INTRAMUSCULAR | Status: AC
  Filled 2017-08-07: qty 10

## 2017-08-07 MED ORDER — CHLORHEXIDINE GLUCONATE 4 % EX LIQD
60.0000 mL | Freq: Once | CUTANEOUS | Status: AC
  Administered 2017-08-07: 4 via TOPICAL
  Filled 2017-08-07: qty 60

## 2017-08-07 MED ORDER — ENOXAPARIN SODIUM 40 MG/0.4ML ~~LOC~~ SOLN: 40.0000 mg | SUBCUTANEOUS | 0 refills | Status: AC

## 2017-08-07 MED ORDER — CEFAZOLIN SODIUM-DEXTROSE 2-4 GM/100ML-% IV SOLN
2.0000 g | INTRAVENOUS | Status: AC
  Administered 2017-08-07: 2 g via INTRAVENOUS
  Filled 2017-08-07: qty 100

## 2017-08-07 MED ORDER — PROPOFOL 10 MG/ML IV BOLUS
INTRAVENOUS | Status: DC | PRN
  Administered 2017-08-07: 200 mg via INTRAVENOUS
  Administered 2017-08-07: 80 mg via INTRAVENOUS

## 2017-08-07 MED ORDER — FENTANYL CITRATE (PF) 100 MCG/2ML IJ SOLN: 100.0000 ug | Freq: Once | INTRAMUSCULAR | Status: DC

## 2017-08-07 MED ORDER — METHOCARBAMOL 750 MG PO TABS: 750.0000 mg | ORAL_TABLET | Freq: Four times a day (QID) | ORAL | 0 refills | Status: AC | PRN

## 2017-08-07 MED ORDER — ONDANSETRON HCL 4 MG/2ML IJ SOLN: 4.0000 mg | Freq: Four times a day (QID) | INTRAMUSCULAR | Status: DC | PRN

## 2017-08-07 MED ORDER — FENTANYL CITRATE (PF) 250 MCG/5ML IJ SOLN
INTRAMUSCULAR | Status: AC
  Filled 2017-08-07: qty 5

## 2017-08-07 MED ORDER — OXYCODONE HCL 5 MG PO TABS
10.0000 mg | ORAL_TABLET | ORAL | Status: DC | PRN
  Administered 2017-08-07 – 2017-08-08 (×4): 15 mg via ORAL
  Filled 2017-08-07 (×4): qty 3

## 2017-08-07 MED ORDER — DOCUSATE SODIUM 100 MG PO CAPS
100.0000 mg | ORAL_CAPSULE | Freq: Two times a day (BID) | ORAL | Status: DC
  Administered 2017-08-07 – 2017-08-08 (×2): 100 mg via ORAL
  Filled 2017-08-07 (×2): qty 1

## 2017-08-07 MED ORDER — OXYCODONE HCL 5 MG PO TABS: 5.0000 mg | ORAL_TABLET | ORAL | Status: DC | PRN

## 2017-08-07 MED ORDER — DOCUSATE SODIUM 100 MG PO CAPS: 100.0000 mg | ORAL_CAPSULE | Freq: Two times a day (BID) | ORAL | 0 refills | Status: AC

## 2017-08-07 MED ORDER — HYDROMORPHONE HCL 2 MG/ML IJ SOLN: 0.5000 mg | INTRAMUSCULAR | Status: DC | PRN

## 2017-08-07 MED ORDER — SENNA 8.6 MG PO TABS
1.0000 | ORAL_TABLET | Freq: Two times a day (BID) | ORAL | Status: DC
  Administered 2017-08-07 – 2017-08-08 (×2): 8.6 mg via ORAL
  Filled 2017-08-07 (×2): qty 1

## 2017-08-07 MED ORDER — SODIUM CHLORIDE 0.9 % IR SOLN
Status: DC | PRN
  Administered 2017-08-07: 6000 mL

## 2017-08-07 MED ORDER — MIDAZOLAM HCL 5 MG/5ML IJ SOLN
INTRAMUSCULAR | Status: DC | PRN
  Administered 2017-08-07: 2 mg via INTRAVENOUS

## 2017-08-07 MED ORDER — LACTATED RINGERS IV SOLN
INTRAVENOUS | Status: DC
  Administered 2017-08-07: 16:00:00 via INTRAVENOUS

## 2017-08-07 MED ORDER — MIDAZOLAM HCL 2 MG/2ML IJ SOLN: 2.0000 mg | Freq: Once | INTRAMUSCULAR | Status: DC

## 2017-08-07 MED ORDER — FENTANYL CITRATE (PF) 100 MCG/2ML IJ SOLN
INTRAMUSCULAR | Status: AC
  Administered 2017-08-07: 100 ug
  Filled 2017-08-07: qty 2

## 2017-08-07 MED ORDER — LIDOCAINE 2% (20 MG/ML) 5 ML SYRINGE
INTRAMUSCULAR | Status: AC
  Filled 2017-08-07: qty 5

## 2017-08-07 MED ORDER — DEXAMETHASONE SODIUM PHOSPHATE 10 MG/ML IJ SOLN
INTRAMUSCULAR | Status: AC
  Filled 2017-08-07: qty 1

## 2017-08-07 MED ORDER — KETOROLAC TROMETHAMINE 30 MG/ML IJ SOLN: 30.0000 mg | Freq: Once | INTRAMUSCULAR | Status: DC | PRN

## 2017-08-07 MED ORDER — CEFAZOLIN SODIUM-DEXTROSE 1-4 GM/50ML-% IV SOLN
INTRAVENOUS | Status: DC | PRN
  Administered 2017-08-07: 1 g via INTRAVENOUS

## 2017-08-07 MED ORDER — MEPERIDINE HCL 50 MG/ML IJ SOLN: 6.2500 mg | INTRAMUSCULAR | Status: DC | PRN

## 2017-08-07 SURGICAL SUPPLY — 74 items
BANDAGE ACE 4X5 VEL STRL LF (GAUZE/BANDAGES/DRESSINGS) ×3 IMPLANT
BLADE GREAT WHITE 4.2 (BLADE) IMPLANT
BLADE GREAT WHITE 4.2MM (BLADE)
BLADE SURG 15 STRL LF DISP TIS (BLADE) IMPLANT
BLADE SURG 15 STRL SS (BLADE)
BNDG COHESIVE 4X5 TAN STRL (GAUZE/BANDAGES/DRESSINGS) ×3 IMPLANT
BNDG COHESIVE 6X5 TAN STRL LF (GAUZE/BANDAGES/DRESSINGS) ×3 IMPLANT
BNDG GAUZE ELAST 4 BULKY (GAUZE/BANDAGES/DRESSINGS) ×3 IMPLANT
BRUSH SCRUB SURG 4.25 DISP (MISCELLANEOUS) ×3 IMPLANT
BUR 3.5 LG SPHERICAL (BURR) IMPLANT
BUR CUDA 2.9 (BURR) ×2 IMPLANT
BUR CUDA 2.9MM (BURR) ×1
BUR GATOR 2.9 (BURR) ×2 IMPLANT
BUR GATOR 2.9MM (BURR) ×1
BUR OVAL 4.0 (BURR) IMPLANT
BUR SPHERICAL 2.9 (BURR) ×2 IMPLANT
BUR SPHERICAL 2.9MM (BURR) ×1
BURR 3.5 LG SPHERICAL (BURR)
BURR 3.5MM LG SPHERICAL (BURR)
CANISTER SUCT 3000ML PPV (MISCELLANEOUS) ×3 IMPLANT
COTTON STERILE ROLL (GAUZE/BANDAGES/DRESSINGS) IMPLANT
CUFF TOURNIQUET SINGLE 34IN LL (TOURNIQUET CUFF) IMPLANT
CUFF TOURNIQUET SINGLE 44IN (TOURNIQUET CUFF) IMPLANT
DECANTER SPIKE VIAL GLASS SM (MISCELLANEOUS) IMPLANT
DISSECTOR 4.0MM X 13CM (MISCELLANEOUS) ×3 IMPLANT
DRAPE ARTHROSCOPY W/POUCH 114 (DRAPES) ×3 IMPLANT
DRAPE OEC MINIVIEW 54X84 (DRAPES) ×3 IMPLANT
DRAPE SURG 17X23 STRL (DRAPES) ×3 IMPLANT
DRAPE U-SHAPE 47X51 STRL (DRAPES) ×3 IMPLANT
DRSG MEPITEL 4X7.2 (GAUZE/BANDAGES/DRESSINGS) ×3 IMPLANT
ELECT REM PT RETURN 9FT ADLT (ELECTROSURGICAL) ×3
ELECTRODE REM PT RTRN 9FT ADLT (ELECTROSURGICAL) ×1 IMPLANT
GAUZE SPONGE 4X4 12PLY STRL (GAUZE/BANDAGES/DRESSINGS) ×3 IMPLANT
GAUZE XEROFORM 1X8 LF (GAUZE/BANDAGES/DRESSINGS) ×3 IMPLANT
GLOVE BIO SURGEON STRL SZ7 (GLOVE) ×6 IMPLANT
GLOVE BIOGEL PI IND STRL 7.5 (GLOVE) ×1 IMPLANT
GLOVE BIOGEL PI IND STRL 8 (GLOVE) ×2 IMPLANT
GLOVE BIOGEL PI INDICATOR 7.5 (GLOVE) ×2
GLOVE BIOGEL PI INDICATOR 8 (GLOVE) ×4
GLOVE SKINSENSE NS SZ8.0 LF (GLOVE) ×2
GLOVE SKINSENSE STRL SZ8.0 LF (GLOVE) ×1 IMPLANT
GOWN STRL REUS W/ TWL LRG LVL3 (GOWN DISPOSABLE) ×1 IMPLANT
GOWN STRL REUS W/ TWL XL LVL3 (GOWN DISPOSABLE) ×1 IMPLANT
GOWN STRL REUS W/TWL LRG LVL3 (GOWN DISPOSABLE) ×2
GOWN STRL REUS W/TWL XL LVL3 (GOWN DISPOSABLE) ×2
KIT BASIN OR (CUSTOM PROCEDURE TRAY) ×3 IMPLANT
KIT TURNOVER KIT B (KITS) ×3 IMPLANT
NEEDLE HYPO 25X1 1.5 SAFETY (NEEDLE) IMPLANT
NS IRRIG 1000ML POUR BTL (IV SOLUTION) ×3 IMPLANT
PACK ARTHROSCOPY DSU (CUSTOM PROCEDURE TRAY) ×3 IMPLANT
PAD ABD 8X10 STRL (GAUZE/BANDAGES/DRESSINGS) ×3 IMPLANT
PAD ARMBOARD 7.5X6 YLW CONV (MISCELLANEOUS) ×6 IMPLANT
PADDING CAST ABS 4INX4YD NS (CAST SUPPLIES) ×2
PADDING CAST ABS COTTON 4X4 ST (CAST SUPPLIES) ×1 IMPLANT
PENCIL BUTTON HOLSTER BLD 10FT (ELECTRODE) IMPLANT
SET ARTHROSCOPY TUBING (MISCELLANEOUS) ×6
SET ARTHROSCOPY TUBING LN (MISCELLANEOUS) ×3 IMPLANT
STOCKINETTE 6  STRL (DRAPES) ×2
STOCKINETTE 6 STRL (DRAPES) ×1 IMPLANT
STRAP ANKLE FOOT DISTRACTOR (ORTHOPEDIC SUPPLIES) ×3 IMPLANT
SUCTION FRAZIER HANDLE 10FR (MISCELLANEOUS)
SUCTION TUBE FRAZIER 10FR DISP (MISCELLANEOUS) IMPLANT
SUT ETHILON 4 0 PS 2 18 (SUTURE) ×3 IMPLANT
SUT VIC AB 3-0 PS1 18 (SUTURE)
SUT VIC AB 3-0 PS1 18XBRD (SUTURE) IMPLANT
SYR BULB IRRIGATION 50ML (SYRINGE) IMPLANT
SYR CONTROL 10ML LL (SYRINGE) IMPLANT
TOWEL OR 17X24 6PK STRL BLUE (TOWEL DISPOSABLE) ×3 IMPLANT
TOWEL OR 17X26 10 PK STRL BLUE (TOWEL DISPOSABLE) ×3 IMPLANT
TUBE CONNECTING 12'X1/4 (SUCTIONS) ×1
TUBE CONNECTING 12X1/4 (SUCTIONS) ×2 IMPLANT
UNDERPAD 30X30 (UNDERPADS AND DIAPERS) ×3 IMPLANT
WAND HAND CNTRL MULTIVAC 90 (MISCELLANEOUS) ×3 IMPLANT
WATER STERILE IRR 1000ML POUR (IV SOLUTION) ×3 IMPLANT

## 2017-08-07 NOTE — Anesthesia Procedure Notes (Signed)
Procedure Name: LMA Insertion Date/Time: 08/07/2017 1:47 PM Performed by: Army Fossa, CRNA Pre-anesthesia Checklist: Patient identified, Emergency Drugs available, Suction available and Patient being monitored Patient Re-evaluated:Patient Re-evaluated prior to induction Oxygen Delivery Method: Circle System Utilized Preoxygenation: Pre-oxygenation with 100% oxygen Induction Type: IV induction Ventilation: Mask ventilation without difficulty LMA: LMA inserted and LMA with gastric port inserted LMA Size: 5.0 Number of attempts: 1 Airway Equipment and Method: Bite block Placement Confirmation: positive ETCO2 Tube secured with: Tape Dental Injury: Teeth and Oropharynx as per pre-operative assessment

## 2017-08-07 NOTE — Op Note (Signed)
08/02/2017 - 08/07/2017  3:03 PM  PATIENT:  Jackson Watson  27 y.o. male  PRE-OPERATIVE DIAGNOSIS: 1.  Left talus fracture      2.  Left ankle intra-articular loose body      3.  Left ankle synovitis and hematoma  POST-OPERATIVE DIAGNOSIS: 1.  Left talus fracture with severe comminution      2.  Multiple left ankle intra-articular loose bodies (largest 1 cm x 1 cm x 4 mm)      3.  Left ankle synovitis and hematoma         Procedure(s): 1.  Left ankle arthroscopy with removal of multiple large loose bodies   2.  Left ankle arthroscopy with extensive debridement of synovitis and hematoma from the anterior, medial and lateral gutters  SURGEON:  [name], MD  ASSISTANT: Alfredo Martinez, PA-C  ANESTHESIA:   General, regional  EBL:  minimal   TOURNIQUET:  approx 1 hour at 250 mm Hg  COMPLICATIONS:  None apparent  DISPOSITION:  Extubated, awake and stable to recovery.  INDICATION FOR PROCEDURE: The patient is a 27 year old male who sustained an injury to his left ankle when jumping off of a tractor 5 days ago.  He underwent open treatment with internal fixation of a right foot Lisfranc injury by Dr. [name].  He was found on CT scan to have a talus fracture on the left.  This appeared to be a comminuted fracture of the anterolateral talus with a large loose body within the ankle joint.  The patient presents now for arthroscopic treatment of this injury.  He understands the risks and benefits of the alternative treatment options and elects surgical treatment.The risks and benefits of the alternative treatment options have been discussed in detail.  The patient wishes to proceed with surgery and specifically understands risks of bleeding, infection, nerve damage, blood clots, need for additional surgery, amputation and death.  PROCEDURE IN DETAIL:  After pre operative consent was obtained, and the correct operative site was identified, the patient was brought to the operating room and placed  supine on the OR table.  Anesthesia was administered.  Pre-operative antibiotics were administered.  A surgical timeout was taken.  The left lower extremity was prepped and draped in standard sterile fashion with a tourniquet around the thigh.  The extremity was exsanguinated and the tourniquet was inflated to 250 mmHg.  An anteromedial arthroscopy portal was established using the nick and spread technique.  The arthroscope was inserted into the ankle joint.  Immediately evident was significant synovitis and hematoma involving the medial and anterior gutters.  An anterolateral arthroscopy portal was then established under direct vision again using the nick and spread technique.  Collene Mares was inserted in the ankle joint.  Debridement was performed in the anterior and lateral gutters removing all synovitis and hematoma.  The anterolateral talus fracture was identified.  There were multiple small bony and cartilaginous fragments.  The smaller fragments were removed through the shaver.  A larger fragment was removed with the biter.  The arthroscope was then used to examine the lateral gutter.  There was grade I chondromalacia over the lateral talar dome.  A large loose body was identified within the tibiotalar joint at the posterior syndesmosis.  A probe was used to displace this fragment from the syndesmosis to the anterior gutter.  The fragment was then grasped.  The anterolateral portal was enlarged allowing complete removal of this fragment.  It measured approximately 1 cm x 1-1/2 cm.  It  was mostly cartilage but had a small layer of bone attached.  The tibial Lafond and talar dome both appeared generally healthy.  The posterior gutter had synovitis and hematoma which was debrided with the shaver.  The medial gutter also had synovitis and hematoma debrided with a shaver.  The fracture was again examined carefully.  The remaining fragments were probed and all were noted to be unstable.  All were removed without  difficulty.  They were all very comminuted and severely traumatized without any evident viable bone or cartilage.  The syndesmosis was then probed and was noted to be stable.  The arthroscopic instruments were then removed.  The incisions were closed with horizontal mattress sutures of 3-0 nylon.  Sterile dressings were applied followed by a cam walker boot.  The tourniquet was released after application of the dressings.  The patient was awakened from anesthesia and transported to the recovery room in stable condition.   FOLLOW UP PLAN: The patient will be weightbearing as tolerated on his left lower extremity in the cam boot.  He can remove it to work on active range of motion.  He will follow-up with me or Dr. [name] in the office in approximately 2 weeks.    Alfredo Martinez PA-C was present and scrubbed for the duration of the operative case. His assistance was essential in positioning the patient, prepping and draping, gaining and maintaining exposure, performing the operation, closing and dressing the wounds and applying the splint.

## 2017-08-07 NOTE — Progress Notes (Signed)
Orthopaedic Trauma Progress Note  S: Pain controled  O:  Vitals:   08/06/17 2211 08/07/17 0619  BP: 134/72 121/61  Pulse: 69 (!) 57  Resp: 18 17  Temp: 98.5 F (36.9 C) 98.1 F (36.7 C)  SpO2: 98%    General: No acute distress, awake alert and oriented x3, cooperative and pleasant. RLE: Splint clean, dry and intact. Able to wiggle toes, sensation intact. LLE: Ankle swollen and painful to move. Motor and sensory intact. Intact DP/PT pulse  Imaging: CT scan of left ankle show lateral avulsion of talus. Tibiotalar joint reduced but small loose body fragments in tibiotalar joint  Labs:  CBC    Component Value Date/Time   WBC 15.1 (H) 08/04/2017 0539   RBC 4.76 08/04/2017 0539   HGB 13.5 08/04/2017 0539   HCT 41.5 08/04/2017 0539   PLT 239 08/04/2017 0539   MCV 87.2 08/04/2017 0539   MCH 28.4 08/04/2017 0539   MCHC 32.5 08/04/2017 0539   RDW 13.1 08/04/2017 0539   LYMPHSABS 2.6 08/02/2017 2041   MONOABS 0.8 08/02/2017 2041   EOSABS 0.1 08/02/2017 2041   BASOSABS 0.0 08/02/2017 2048    A/P: 27 year old s/p ORIF of right Lisfranc fx dislocation and with left ankle injury possible dislocation with spontatenous reduction  Plan for arthroscopic I&D with Dr. Victorino Dike today. Likely discharge home Wednesday AM.  Weightbearing: NWB RLE, WBAT LLE Insicional and dressing care: Adaptic, 4x4s and kerlix to right legsplint clean, dry and intact Orthopedic device(s): Boot to LLE Showering: Not yet VTE prophylaxis: Lovenox for VTE Pain control: oxycodone and robaxin, added toradol Follow - up plan: TBD   [name], MD [facility] [phone] (phone)

## 2017-08-07 NOTE — Anesthesia Postprocedure Evaluation (Signed)
Anesthesia Post Note  Patient: Kaylan Friedmann  Procedure(s) Performed: ANKLE ARTHROSCOPY with removal of loose body and extensive debridement; stress exam (Left Ankle)     Patient location during evaluation: PACU Anesthesia Type: General Level of consciousness: awake Pain management: pain level controlled Vital Signs Assessment: post-procedure vital signs reviewed and stable Respiratory status: spontaneous breathing Cardiovascular status: stable Postop Assessment: no apparent nausea or vomiting Anesthetic complications: no    Last Vitals:  Vitals:   08/07/17 1545 08/07/17 1557  BP: 137/70 137/70  Pulse: 83 84  Resp: 18 13  Temp:  36.6 C  SpO2: 97% 100%    Last Pain:  Vitals:   08/07/17 1557  TempSrc:   PainSc: 0-No pain   Pain Goal: Patients Stated Pain Goal: 3 (08/07/17 0700)  LLE Motor Response: No movement due to regional block (08/07/17 1557) LLE Sensation: Decreased(regional block) (08/07/17 1557)          Dashaun Onstott JR,JOHN Susann Givens

## 2017-08-07 NOTE — Progress Notes (Signed)
PT Cancellation Note  Patient Details Name: Jackson Watson MRN: [id number] DOB: October 17, 1990   Cancelled Treatment:    Reason Eval/Treat Not Completed: Other (comment)(Pt schedule for orthopedic surgery this date, later this afternoon. Will hold PT treatment at this time. Please place new order or continuation after procedure. )   10:40 AM, 08/07/17 Rosamaria Lints, PT, DPT Physical Therapist - [place] [phone] (Pager)  [phone] (Office)      [name],[name] 08/07/2017, 10:40 AM

## 2017-08-07 NOTE — Anesthesia Preprocedure Evaluation (Signed)
Anesthesia Evaluation  Patient identified by MRN, date of birth, ID band Patient awake    Reviewed: Allergy & Precautions, NPO status , Patient's Chart, lab work & pertinent test results  Airway Mallampati: I  TM Distance: >3 FB     Dental no notable dental hx. (+) Teeth Intact   Pulmonary neg pulmonary ROS,    breath sounds clear to auscultation       Cardiovascular negative cardio ROS Normal cardiovascular exam Rhythm:Regular Rate:Normal     Neuro/Psych negative neurological ROS  negative psych ROS   GI/Hepatic negative GI ROS, Neg liver ROS,   Endo/Other  negative endocrine ROS  Renal/GU negative Renal ROS  negative genitourinary   Musculoskeletal negative musculoskeletal ROS (+)   Abdominal (+) + obese,   Peds  Hematology   Anesthesia Other Findings   Reproductive/Obstetrics                             Anesthesia Physical  Anesthesia Plan  ASA: II  Anesthesia Plan: General   Post-op Pain Management:  Regional for Post-op pain   Induction: Intravenous  PONV Risk Score and Plan: Treatment may vary due to age or medical condition, Ondansetron, Dexamethasone and Midazolam  Airway Management Planned: LMA  Additional Equipment:   Intra-op Plan:   Post-operative Plan:   Informed Consent: I have reviewed the patients History and Physical, chart, labs and discussed the procedure including the risks, benefits and alternatives for the proposed anesthesia with the patient or authorized representative who has indicated his/her understanding and acceptance.   Dental advisory given  Plan Discussed with: CRNA and Surgeon  Anesthesia Plan Comments:         Anesthesia Quick Evaluation

## 2017-08-07 NOTE — Consult Note (Signed)
Reason for Consult:  Left ankle injury Referring Physician:  Dr. [name] is an 27 y.o. male.  HPI:  27 y/o male without significant PMH jumped off a tractor 5 days ago injuring both LEs.  On the right he ha d a lisfranc injury that was treated surgically by Dr. [name].  On the right he had a CT scan of the ankle which reveals a loose body in the tibiotalar joint as well as a comminuted fracture of the medial talus.  He has been bearing weight on the ankle in a cam boot without significant pain.  He denies any h/o injury to the left foot or ankle.  He is not diabetic and doesn't smoke.  History reviewed. No pertinent past medical history.  Past Surgical History:  Procedure Laterality Date  . L humerus fx repair    . OPEN REDUCTION INTERNAL FIXATION (ORIF) FOOT LISFRANC FRACTURE Right 08/03/2017   Procedure: OPEN REDUCTION INTERNAL FIXATION (ORIF) FOOT LISFRANC FRACTURE;  Surgeon: [name], MD;  Location: [facility] OR;  Service: Orthopedics;  Laterality: Right;  . r collarbone sugery      History reviewed. No pertinent family history.  Social History:  reports that he has never smoked. His smokeless tobacco use includes chew. He reports that he drinks alcohol. His drug history is not on file.  Allergies: No Known Allergies  Medications: I have reviewed the patient's current medications.  Results for orders placed or performed during the hospital encounter of 08/02/17 (from the past 48 hour(s))  Surgical pcr screen     Status: None   Collection Time: 08/06/17  5:22 PM  Result Value Ref Range   MRSA, PCR NEGATIVE NEGATIVE   Staphylococcus aureus NEGATIVE NEGATIVE    Comment: (NOTE) The Xpert SA Assay (FDA approved for NASAL specimens in patients 53 years of age and older), is one component of a comprehensive surveillance program. It is not intended to diagnose infection nor to guide or monitor treatment. Performed at [facility], [address], [place],  [place] [address]   Glucose, capillary     Status: None   Collection Time: 08/07/17  8:26 AM  Result Value Ref Range   Glucose-Capillary 83 65 - 99 mg/dL   Comment 1 Document in Chart     No results found.  ROS:  No recent f/c/n/v/wt loss PE:  Blood pressure 121/61, pulse (!) 57, temperature 98.1 F (36.7 C), temperature source Oral, resp. rate 17, height  (1.93 m), weight (!) 142.9 kg (315 lb), SpO2 98 %. wn wd male in nad.  A and  o  X 4.  Mood and affect normal.  EOMI.  Resp unlabored.  R LE splinted.  L LE with healthy skin and moderate swelling.  2+ dp pulse and brisk cap refill at the toes.  Sens to LT dorsally and plantarly at the forefoot is intact.  5/5 strength in PF and DF of the toes.  No lympadenopathy.  No gross deformity about the ankle.  Assessment/Plan: L talus fracture with ankle loose body - I explained the nature of the injury to the patient in detail.  I believe the talus fracture can be treated safely in closed fashion.  The comminuted fragments in the joint though are likely to cause mechanical symptoms and could further damage the joint.  I believe it's medically necessary to consider surgical treatment for the intra-articular loose bodies.  He understands the risks and benefits of the alternative treatment  options and elects surgical treatment.  He will need left ankle arthroscopy with removal of loose bodies and debridement of any synovitis.  The risks and benefits of the alternative treatment options have been discussed in detail.  The patient wishes to proceed with surgery and specifically understands risks of bleeding, infection, nerve damage, blood clots, need for additional surgery, amputation and death.   Jackson Watson 08/27/17, 1:09 PM

## 2017-08-07 NOTE — Transfer of Care (Signed)
Immediate Anesthesia Transfer of Care Note  Patient: Jackson Watson  Procedure(s) Performed: ANKLE ARTHROSCOPY with removal of loose body and extensive debridement; stress exam (Left Ankle)  Patient Location: PACU  Anesthesia Type:General  Level of Consciousness: awake  Airway & Oxygen Therapy: Patient Spontanous Breathing and Patient connected to face mask oxygen  Post-op Assessment: Report given to RN and Post -op Vital signs reviewed and stable  Post vital signs: Reviewed and stable  Last Vitals:  Vitals Value Taken Time  BP 135/77 08/07/2017  3:16 PM  Temp 36.6 C 08/07/2017  3:15 PM  Pulse 94 08/07/2017  3:19 PM  Resp 19 08/07/2017  3:19 PM  SpO2 100 % 08/07/2017  3:19 PM  Vitals shown include unvalidated device data.  Last Pain:  Vitals:   08/07/17 1515  TempSrc:   PainSc: Asleep      Patients Stated Pain Goal: 3 (08/07/17 0700)  Complications: No apparent anesthesia complications

## 2017-08-07 NOTE — Anesthesia Procedure Notes (Signed)
Anesthesia Regional Block: Popliteal block   Pre-Anesthetic Checklist: ,, timeout performed, Correct Patient, Correct Site, Correct Laterality, Correct Procedure, Correct Position, site marked, Risks and benefits discussed,  Surgical consent,  Pre-op evaluation,  At surgeon's request and post-op pain management  Laterality: Left  Prep: chloraprep       Needles:  Injection technique: Single-shot  Needle Type: Echogenic Needle     Needle Length: 9cm  Needle Gauge: 21     Additional Needles:   Procedures:,,,, ultrasound used (permanent image in chart),,,,  Narrative:  Start time: 08/07/2017 1:20 PM End time: 08/07/2017 1:30 PM Injection made incrementally with aspirations every 5 mL.  Performed by: Personally  Anesthesiologist: Cecile Hearing, MD  Additional Notes: No pain on injection. No increased resistance to injection. Injection made in 5cc increments.  Good needle visualization.  Patient tolerated procedure well.  Combined left popliteal/saphenous nerve block.

## 2017-08-08 ENCOUNTER — Encounter (HOSPITAL_COMMUNITY): Payer: Self-pay | Admitting: Orthopedic Surgery

## 2017-08-08 NOTE — Progress Notes (Signed)
Subjective: 1 Day Post-Op Procedure(s) (LRB): ANKLE ARTHROSCOPY with removal of loose body and extensive debridement; stress exam (Left)  Patient reports pain as mild.  Reports that nerve block hasn't worn off yet and that he can't feel his toes just yet.  Denies fever, chills, N/V, CP, SOB.  Tolerating POs well.  Admits to flatus.  Objective:   VITALS:  Temp:  [97.7 F (36.5 C)-98.3 F (36.8 C)] 97.7 F (36.5 C) (05/01 0428) Pulse Rate:  [64-106] 64 (05/01 0428) Resp:  [13-20] 18 (04/30 1950) BP: (126-137)/(58-77) 127/62 (05/01 0428) SpO2:  [94 %-100 %] 98 % (05/01 0428)  General: WDWN patient in NAD. Psych:  Appropriate mood and affect. Neuro:  A&O x 3, Moving all extremities, sensation diminished to light touch due to nerve block HEENT:  EOMs intact Chest:  Even non-labored respirations Skin:  Dressing/CAM Boot on L LE, SLS on R LE C/D/I, no rashes or lesions Extremities: warm/dry, mild edema, no erythema or echymosis.  No lymphadenopathy. Pulses: Popliteus 2+   MSK:  ROM: TKE;, MMT: able to perform quad set    LABS No results for input(s): HGB, WBC, PLT in the last 72 hours. No results for input(s): NA, K, CL, CO2, BUN, CREATININE, GLUCOSE in the last 72 hours. No results for input(s): LABPT, INR in the last 72 hours.   Assessment/Plan: 1 Day Post-Op Procedure(s) (LRB): ANKLE ARTHROSCOPY with removal of loose body and extensive debridement; stress exam (Left)  WBAT L LE in CAM boot. Active ROM on LE outside of boot is encouraged, pain free. NWB R LE F/u with Dr. [name] or Dr. Victorino Dike in 2 weeks for outpatient post-op visit.  Alfredo Martinez PA-C [facility] Office:  [phone]

## 2017-08-08 NOTE — Care Management Note (Addendum)
Case Management Note  Patient Details  Name: Elick Aguilera MRN: [id number] Date of Birth: 04/22/1990  Subjective/Objective:   Bilateral ankle fractures, ORIF   08/03/2017               Action/Plan: NCM spoke to pt and offered choice for [facility]. Pt requested [facility] for HHPT. Contacted [facility] and they work through [facility]. Contacted [facility]. Left message. Contacted [facility] for DME for home to be delivered to room prior to dc.  Received call back from [facility], [facility] PT available on Monday. Made pt aware and agreeable to HHPT start of care for Monday. Faxed # [phone] orders, demographics, and op note to [facility].    Expected Discharge Date:  08/08/17               Expected Discharge Plan:  Home w Home Health Services  In-House Referral:  NA  Discharge planning Services  CM Consult  Post Acute Care Choice:  Home Health Choice offered to:  Patient  DME Arranged:  3-N-1, Walker rolling, Community education officer wheelchair with seat cushion DME Agency:  [facility]  HH Arranged:  PT HH Agency:  Other - See comment  Status of Service:  Completed, signed off  If discussed at Long Length of Stay Meetings, dates discussed:    Additional Comments:  Elliot Cousin, RN 08/08/2017, 2:16 PM

## 2017-08-08 NOTE — Progress Notes (Signed)
..      Durable Medical Equipment  (From admission, onward)        Start     Ordered   08/08/17 0931  For home use only DME 3 n 1  Once     08/08/17 0931   08/08/17 0931  For home use only DME Walker rolling  Once    Question:  Patient needs a walker to treat with the following condition  Answer:  Bilateral ankle fractures   08/08/17 0931   08/08/17 0931  For home use only DME lightweight manual wheelchair with seat cushion  Once    Comments:  Patient suffers from bilateral ankle fracture which impairs their ability to perform daily activities like walking, standing in the home.  A rolling walker will not resolve  issue with performing activities of daily living. A wheelchair will allow patient to safely perform daily activities. Patient is not able to propel themselves in the home using a standard weight wheelchair due to non weight bearing, pain. Patient can self propel in the lightweight wheelchair.  Accessories: elevating leg rests (ELRs), wheel locks, extensions and anti-tippers.   08/08/17 1610

## 2017-08-08 NOTE — Discharge Instructions (Signed)
Keep splint on right leg intact and dry.  Do not put weight on right leg.  You can put as much weight on your left leg as you wish in your CAM boot.  No driving.

## 2017-08-08 NOTE — Progress Notes (Signed)
Physical Therapy Treatment Patient Details Name: Jackson Watson MRN: [id number] DOB: [birth year] Today's Date: 08/08/2017    History of Present Illness Jackson Watson is a 27 y/o male admitted on 08/02/17 after jumping from tractor with resultant R Lisfranc fracture dislocation as well as L ankle/foot injury. ORIF performed on 08/03/17 with patient NWB on R LE. Cam boot and WBAT orders for L LE. No significant PMH.    PT Comments    Pt making excellent progress with functional mobility, requiring less physical assistance with mobility this session. Plan is for pt to d/c home today with family. PT answered all of pt's questions at end of session. Pt would continue to benefit from skilled physical therapy services at this time while admitted and after d/c to address the below listed limitations in order to improve overall safety and independence with functional mobility.    Follow Up Recommendations  Home health PT;Supervision for mobility/OOB     Equipment Recommendations  Rolling walker with 5" wheels;Wheelchair (measurements PT);Wheelchair cushion (measurements PT)(w/c with ELEVATING LEG RESTS)    Recommendations for Other Services       Precautions / Restrictions Precautions Precautions: Fall Required Braces or Orthoses: Other Brace/Splint Other Brace/Splint: LLE CAM boot Restrictions Weight Bearing Restrictions: Yes RLE Weight Bearing: Non weight bearing LLE Weight Bearing: Weight bearing as tolerated    Mobility  Bed Mobility Overal bed mobility: Needs Assistance Bed Mobility: Supine to Sit     Supine to sit: Supervision     General bed mobility comments: Supervision for safety  Transfers Overall transfer level: Needs assistance Equipment used: Rolling walker (2 wheeled) Transfers: Sit to/from Stand Sit to Stand: From elevated surface;Min guard         General transfer comment: increased time, good technique, min guard for safety  Ambulation/Gait Ambulation/Gait  assistance: Min guard Ambulation Distance (Feet): 20 Feet Assistive device: Rolling walker (2 wheeled) Gait Pattern/deviations: (hop-to on L LE) Gait velocity: decreased Gait velocity interpretation: <1.31 ft/sec, indicative of household ambulator General Gait Details: pt steady with RW and able to maintain NWB R LE independently throughout   Stairs             Wheelchair Mobility    Modified Rankin (Stroke Patients Only)       Balance Overall balance assessment: Needs assistance Sitting-balance support: No upper extremity supported Sitting balance-Leahy Scale: Good     Standing balance support: Bilateral upper extremity supported Standing balance-Leahy Scale: Poor Standing balance comment: reliant on BUE support. RLE NWB                            Cognition Arousal/Alertness: Awake/alert Behavior During Therapy: WFL for tasks assessed/performed Overall Cognitive Status: Within Functional Limits for tasks assessed                                        Exercises      General Comments        Pertinent Vitals/Pain Pain Assessment: No/denies pain    Home Living                      Prior Function            PT Goals (current goals can now be found in the care plan section) Acute Rehab PT Goals PT Goal Formulation: With patient Time For Goal  Achievement: 08/18/17 Potential to Achieve Goals: Good Progress towards PT goals: Progressing toward goals    Frequency    Min 5X/week      PT Plan Current plan remains appropriate    Co-evaluation              AM-PAC PT "6 Clicks" Daily Activity  Outcome Measure  Difficulty turning over in bed (including adjusting bedclothes, sheets and blankets)?: None Difficulty moving from lying on back to sitting on the side of the bed? : None Difficulty sitting down on and standing up from a chair with arms (e.g., wheelchair, bedside commode, etc,.)?: Unable Help needed  moving to and from a bed to chair (including a wheelchair)?: None Help needed walking in hospital room?: A Little Help needed climbing 3-5 steps with a railing? : A Lot 6 Click Score: 18    End of Session Equipment Utilized During Treatment: Gait belt Activity Tolerance: Patient tolerated treatment well Patient left: in chair;with call bell/phone within reach;with family/visitor present Nurse Communication: Mobility status PT Visit Diagnosis: Unsteadiness on feet (R26.81);Other abnormalities of gait and mobility (R26.89);Difficulty in walking, not elsewhere classified (R26.2);Muscle weakness (generalized) (M62.81)     Time: 1610-9604 PT Time Calculation (min) (ACUTE ONLY): 10 min  Charges:  $Gait Training: 8-22 mins                    G Codes:       [place], [place], [place] [phone]    Alessandra Bevels Klara Stjames 08/08/2017, 1:57 PM

## 2017-08-10 ENCOUNTER — Encounter: Payer: Self-pay | Admitting: Student

## 2017-08-10 DIAGNOSIS — S92152A Displaced avulsion fracture (chip fracture) of left talus, initial encounter for closed fracture: Secondary | ICD-10-CM | POA: Insufficient documentation

## 2017-08-10 NOTE — Discharge Summary (Signed)
[facility] ([facility])  Patient ID: Jackson Watson MRN: [id number] DOB/AGE: 1990/11/17 26 y.o.  Admit date: 08/02/2017 Discharge date: 08/10/2017  Admission Diagnoses: Right Lisfranc fracture dislocation Left talus fracture with impaction of tibiotalar joint  Discharge Diagnoses:  Same  History reviewed. No pertinent past medical history.  Procedures Performed: 08/03/17 (Dr. [name]): 1. CPT 28615(x3)-Open reduction internal fixation of tarsometatarsal dislocations 1st-3rd 2. CPT 28730-Fusion of Lisfranc joint 3. CPT 28605-Closed reduction of 4th and 5th TMT joint dislocations 4. CPT 28455-Closed treatment of right cuboid fracture 5. CPT 97605-Incisional wound vac placement  08/07/17 (Dr. Victorino Dike): 1.  Left ankle arthroscopy with removal of multiple large loose bodies 2.  Left ankle arthroscopy with extensive debridement of synovitis and hematoma from the anterior, medial and lateral gutters  Discharged Condition: good  Hospital Course: The patient was admitted from [facility].  A closed reduction was attempted in the emergency room however this was unsuccessful.  He underwent fixation of his right Lisfranc joint the following day on 08/03/2017.  He did well following this surgery.  However he continued to complain of significant pain to his left lower extremity.  Dr. he was contacted for ankle arthroscopy.  He perform this on 08/07/2017.  He was able to mobilize with physical therapy and did well.  He was discharged home on Aug 08, 2017.  Upon discharge she was tolerating regular diet, voiding spontaneously and pain was well controlled with oral medications.  Consults: None  Significant Diagnostic Studies: None  Treatments: surgery: as above  Discharge Exam: Temp:  [97.7 F (36.5 C)-98.3 F (36.8 C)] 97.7 F (36.5 C) (05/01 0428) Pulse Rate:  [64-106] 64 (05/01 0428) Resp:  [13-20] 18 (04/30 1950) BP: (126-137)/(58-77) 127/62 (05/01 0428) SpO2:  [94 %-100 %] 98  % (05/01 0428)  General: WDWN patient in NAD. Psych:  Appropriate mood and affect. Neuro:  A&O x 3, Moving all extremities, sensation diminished to light touch due to nerve block HEENT:  EOMs intact Chest:  Even non-labored respirations Skin:  Dressing/CAM Boot on L LE, SLS on R LE C/D/I, no rashes or lesions Extremities: warm/dry, mild edema, no erythema or echymosis.  No lymphadenopathy. Pulses: Popliteus 2+                MSK:  ROM: TKE;, MMT: able to perform quad set  Disposition: Home   Allergies as of 08/08/2017   No Known Allergies     Medication List    TAKE these medications   docusate sodium 100 MG capsule Commonly known as:  COLACE Take 1 capsule (100 mg total) by mouth 2 (two) times daily.   enoxaparin 40 MG/0.4ML injection Commonly known as:  LOVENOX Inject 0.4 mLs (40 mg total) into the skin daily.   methocarbamol 750 MG tablet Commonly known as:  ROBAXIN-750 Take 1 tablet (750 mg total) by mouth every 6 (six) hours as needed for muscle spasms.   oxyCODONE 15 MG immediate release tablet Commonly known as:  ROXICODONE Take 1 tablet (15 mg total) by mouth every 4 (four) hours as needed for breakthrough pain.      Follow-up Information    Haddix, Gillie Manners, MD. Schedule an appointment as soon as possible for a visit in 2 week(s).   Specialty:  Orthopedic Surgery Contact information: [address] [place] [address] [place] [address] [phone]        [facility] Follow up.   Why:  Home Health Physical Therapy-agency will call to arrange  initial visit Contact information: [phone]          Discharge Instructions and Plan: Patient will be discharged home.  He will be nonweightbearing on right lower extremity, weightbearing as tolerated in the boot on the left lower extremity.  Return to clinic in 2 weeks for x-rays and suture removal.  He will be on Lovenox for DVT prophylaxis.  Signed:  [name], MD [facility] [phone] (phone) 08/10/2017, 10:47 AM

## 2019-12-29 IMAGING — DX DG ANKLE COMPLETE 3+V*R*
3 series · 3 of 3 positions shown · non-contrast
Comparison: None.

CLINICAL DATA: Right foot and ankle pain after jumping off a truck
and landing on his feet.

EXAM:
RIGHT ANKLE - COMPLETE 3+ VIEW

[ankle ap]
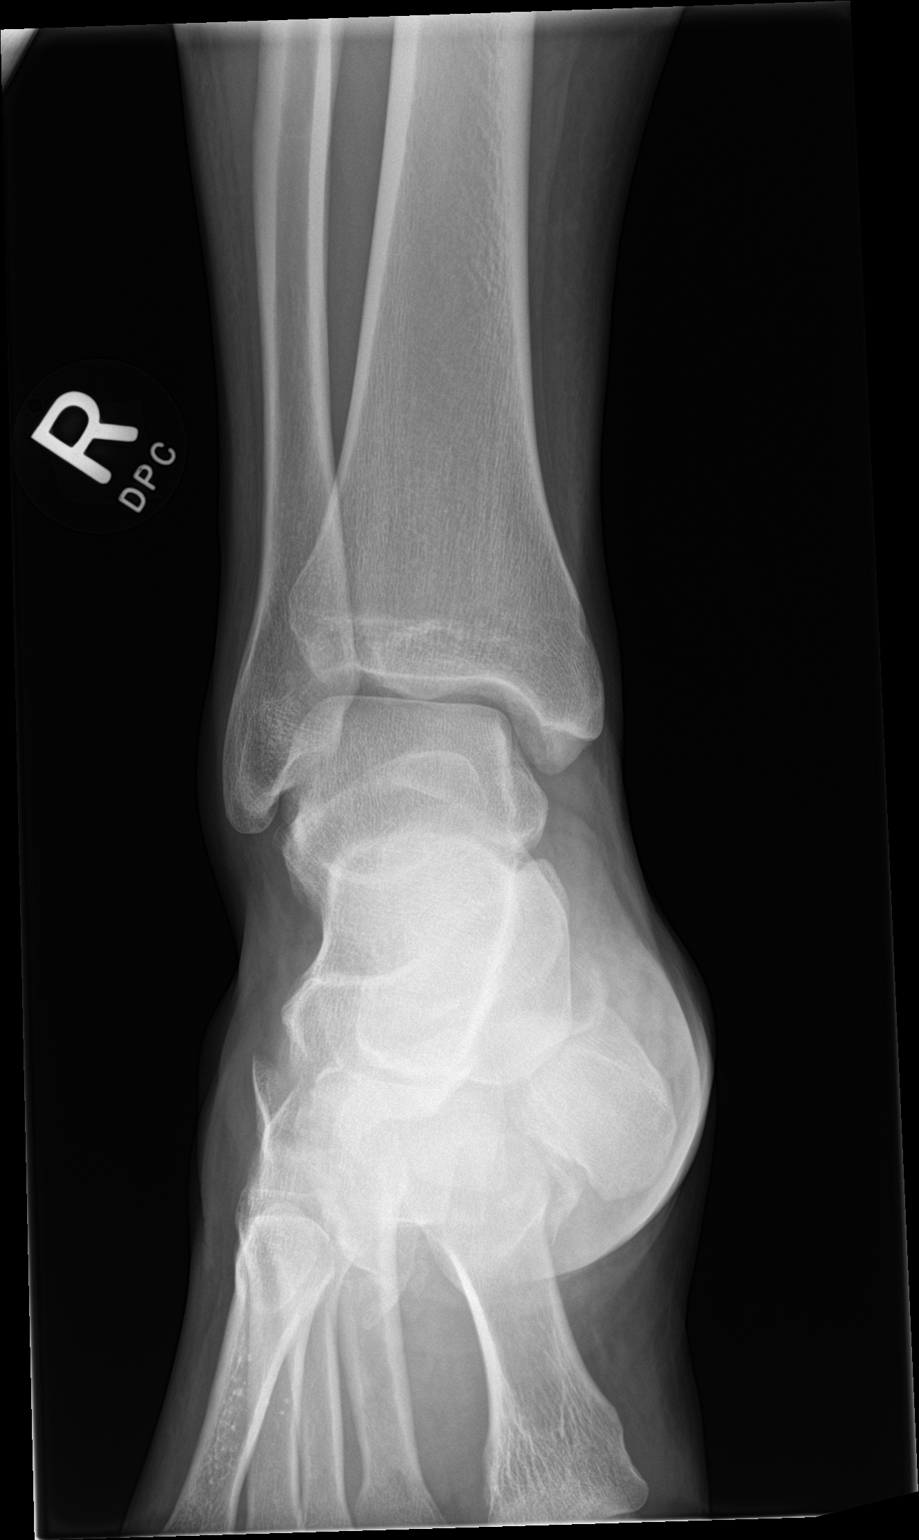

[ankle obl]
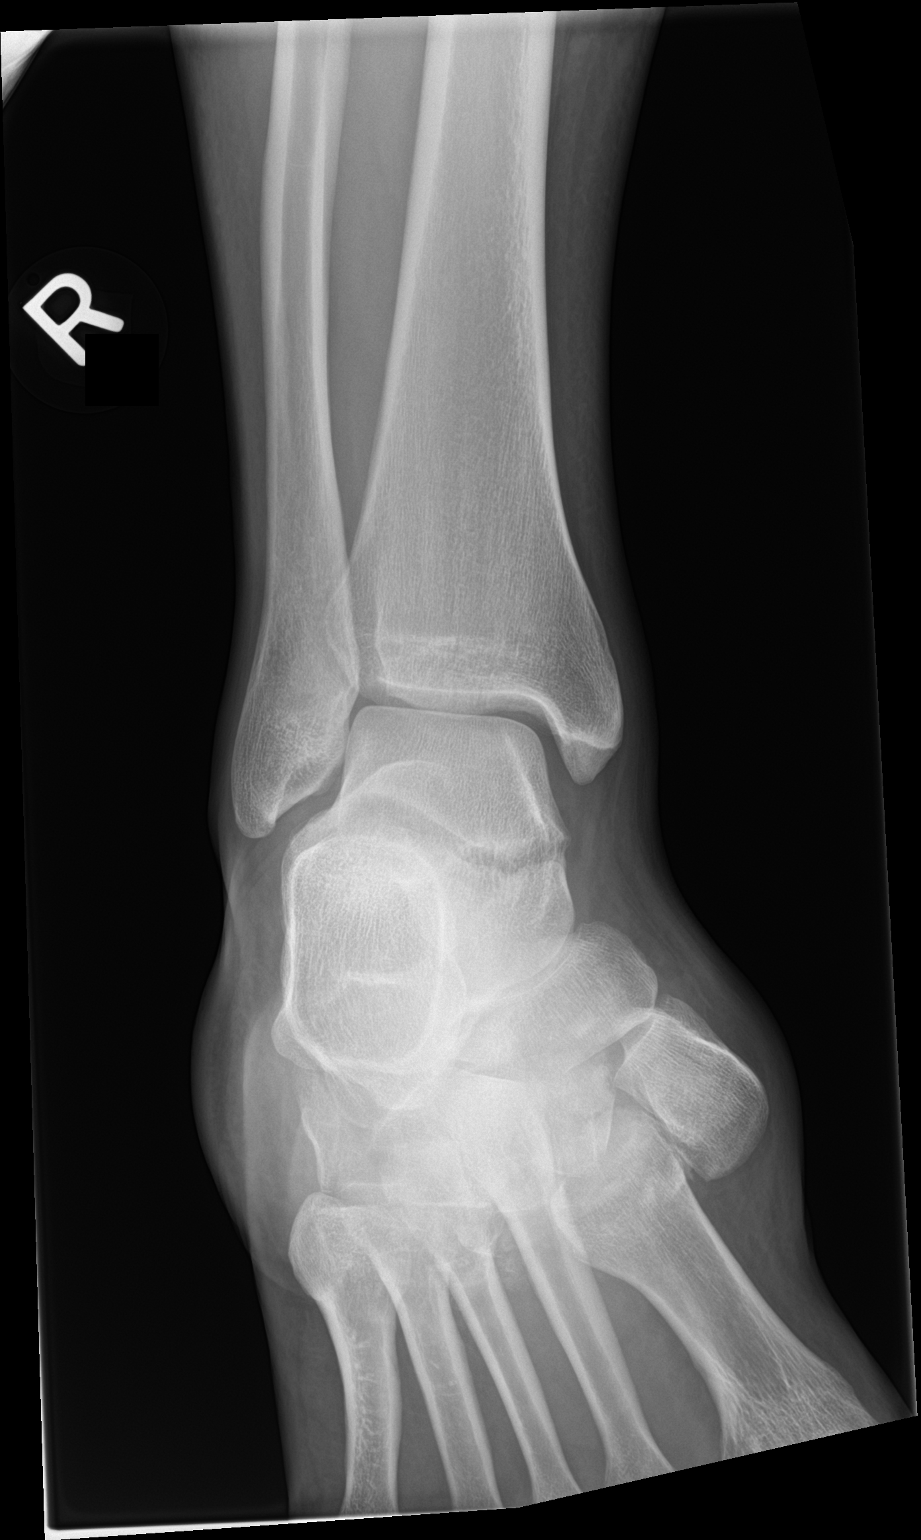

[ankle lat]
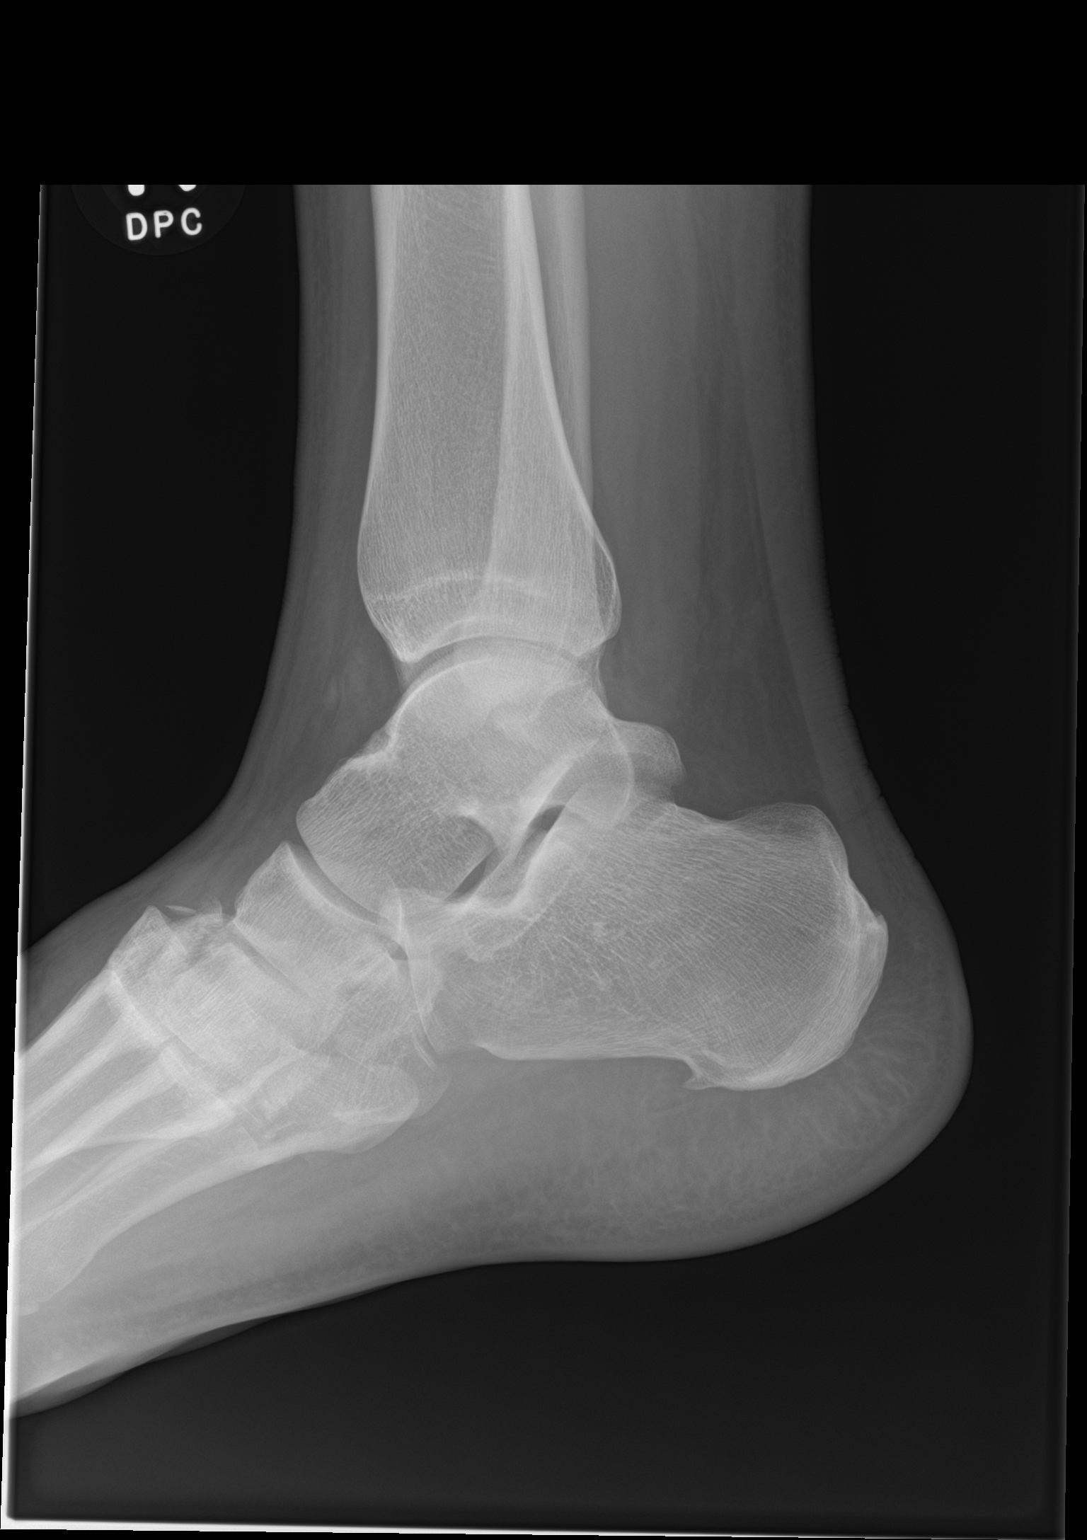

[3 of 3 positions shown; findings below may reference images not displayed]

FINDINGS: Acute homolateral Lisfranc fracture dislocation is identified with
lateral displacement of the metatarsal bases and medial dislocation
of the medial cuneiform. Small ossific fracture fragments project
about the midfoot likely off the base of the metatarsals, the
largest is seen on the AP view measuring 1.4 x 0.9 cm likely off the
second metatarsal base. Additional 5 and 6 mm fracture fragments
project over the dorsum midfoot.

Intact distal tibia and fibula. Intact ankle and subtalar joints.
Intact talonavicular and calcaneocuboid articulations.

Calcaneal enthesopathy is seen along the plantar dorsal aspect.
Intact anterolateral process of the calcaneus.
IMPRESSION: Acute homolateral Lisfranc fracture-dislocation.

## 2019-12-30 IMAGING — CR DG FOOT COMPLETE 3+V*R*
3 series · 3 of 3 positions shown · non-contrast
Comparison: 08/02/2017 right foot radiographs and CT foot.

CLINICAL DATA: 26 y/o  M; ORIF of the right foot.

EXAM:
RIGHT FOOT COMPLETE - 3+ VIEW

[AP]
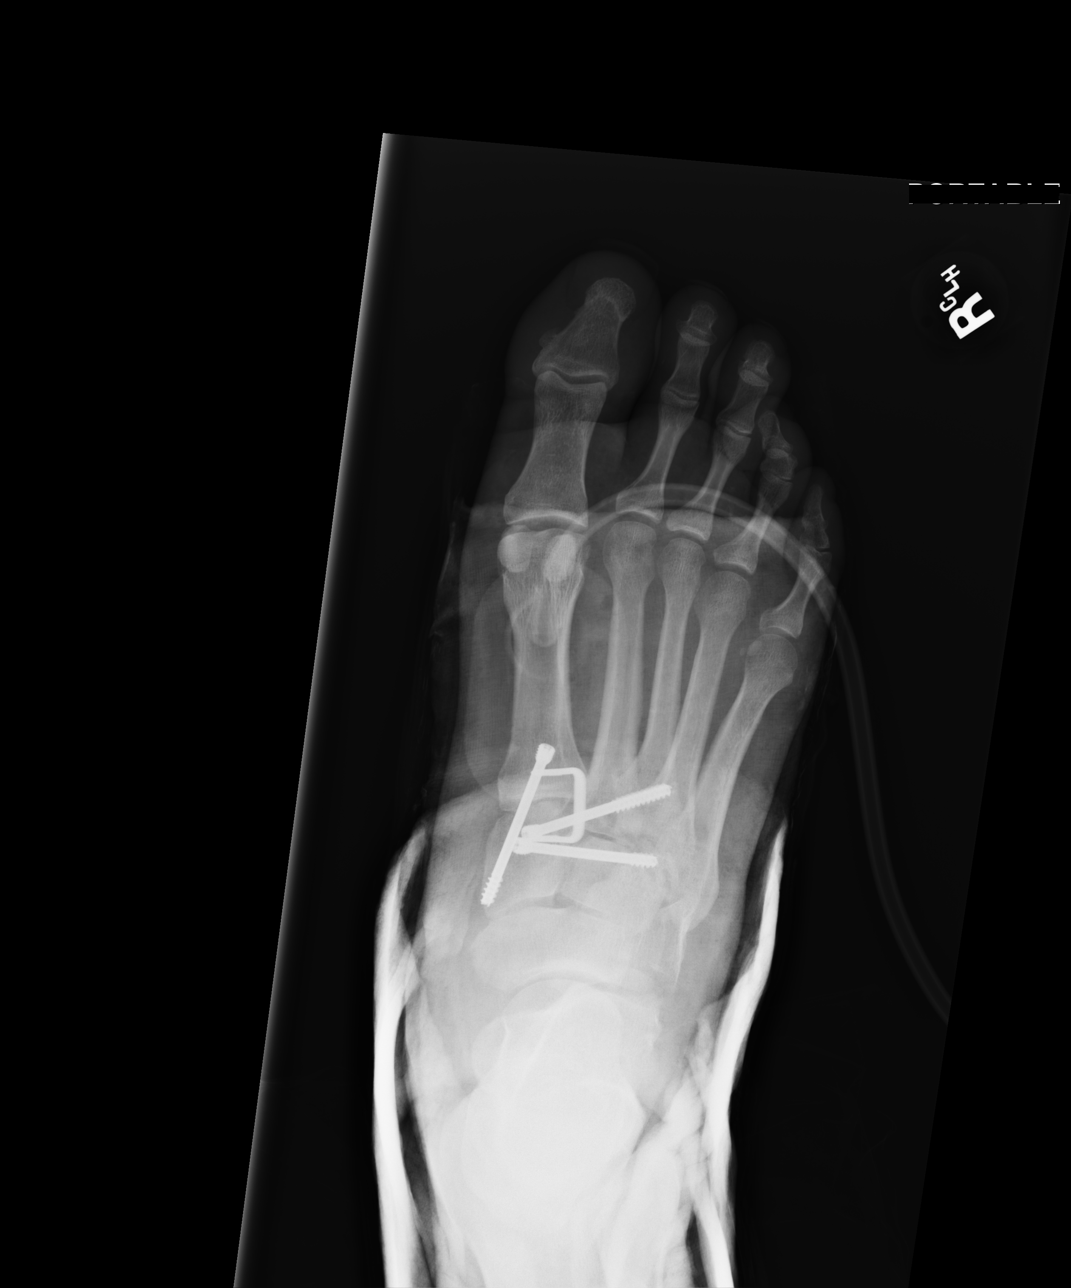

[lateral]
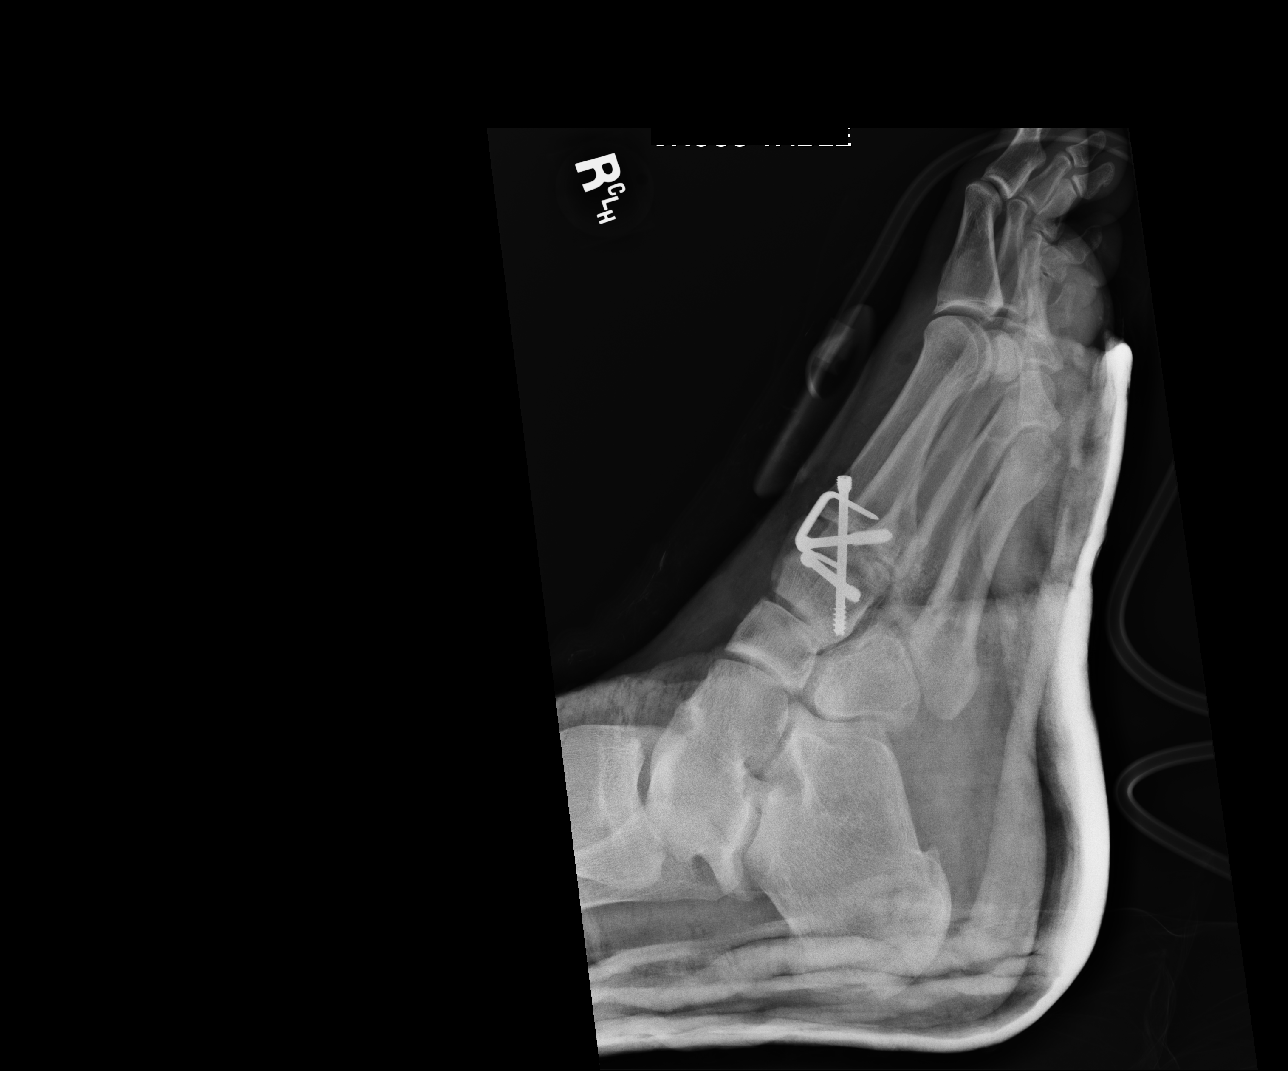

[ap obl int rot]
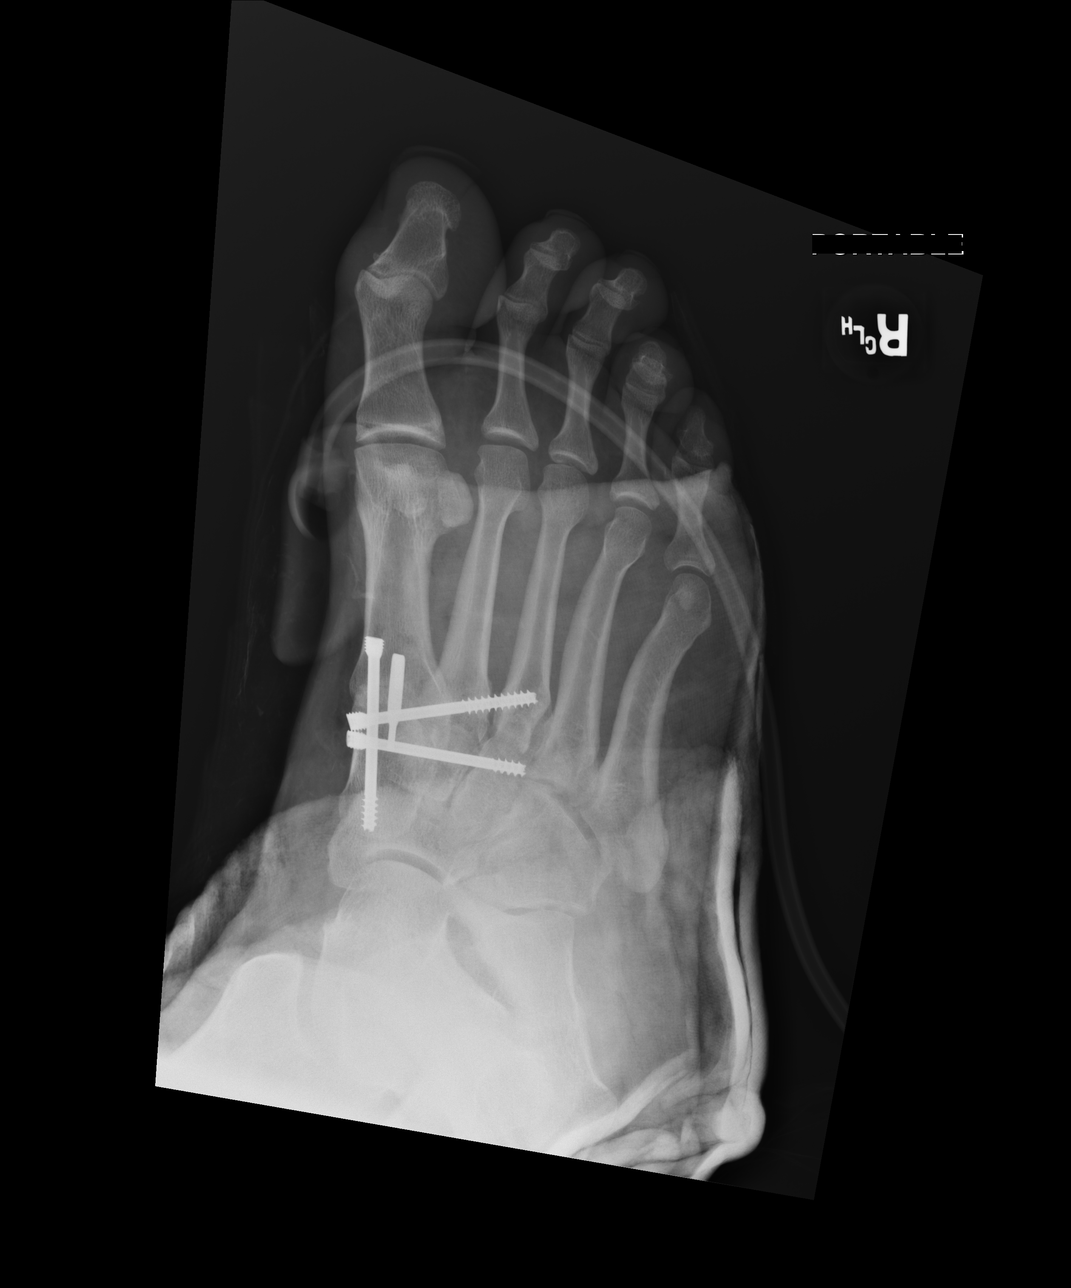

[3 of 3 positions shown; findings below may reference images not displayed]

FINDINGS: Interval reduction of Lisfranc fracture dislocation with screw and
pin fixation across the base of first through third metatarsal bones
and adjacent cuneiform bones. Stable comminuted fracture of the
lateral cuboid. No new fracture or dislocation identified.
IMPRESSION: Interval reduction of Lisfranc fracture dislocation. Stable
comminuted fracture of lateral cuboid bone. No new fracture or
dislocation.

By: Chinita Hurley M.D.
